# Patient Record
Sex: Female | Born: 1965 | Race: White | Hispanic: No | Smoking: Former smoker
Health system: Southern US, Community
[De-identification: ages and names within clinical notes are randomized; demographics above are authoritative.]

## PROBLEM LIST (undated history)

## (undated) DIAGNOSIS — T7840XA Allergy, unspecified, initial encounter: Secondary | ICD-10-CM

## (undated) DIAGNOSIS — G43909 Migraine, unspecified, not intractable, without status migrainosus: Secondary | ICD-10-CM

## (undated) DIAGNOSIS — E785 Hyperlipidemia, unspecified: Secondary | ICD-10-CM

## (undated) DIAGNOSIS — E039 Hypothyroidism, unspecified: Secondary | ICD-10-CM

## (undated) DIAGNOSIS — I10 Essential (primary) hypertension: Secondary | ICD-10-CM

## (undated) DIAGNOSIS — F419 Anxiety disorder, unspecified: Secondary | ICD-10-CM

## (undated) DIAGNOSIS — F329 Major depressive disorder, single episode, unspecified: Secondary | ICD-10-CM

## (undated) DIAGNOSIS — M1990 Unspecified osteoarthritis, unspecified site: Secondary | ICD-10-CM

## (undated) DIAGNOSIS — F32A Depression, unspecified: Secondary | ICD-10-CM

## (undated) DIAGNOSIS — K219 Gastro-esophageal reflux disease without esophagitis: Secondary | ICD-10-CM

## (undated) DIAGNOSIS — J449 Chronic obstructive pulmonary disease, unspecified: Secondary | ICD-10-CM

## (undated) DIAGNOSIS — G459 Transient cerebral ischemic attack, unspecified: Secondary | ICD-10-CM

## (undated) HISTORY — DX: Gastro-esophageal reflux disease without esophagitis: K21.9

## (undated) HISTORY — PX: FRACTURE SURGERY: SHX138

## (undated) HISTORY — DX: Unspecified osteoarthritis, unspecified site: M19.90

## (undated) HISTORY — DX: Chronic obstructive pulmonary disease, unspecified: J44.9

## (undated) HISTORY — DX: Essential (primary) hypertension: I10

## (undated) HISTORY — PX: PARTIAL HYSTERECTOMY: SHX80

## (undated) HISTORY — DX: Major depressive disorder, single episode, unspecified: F32.9

## (undated) HISTORY — PX: OTHER SURGICAL HISTORY: SHX169

## (undated) HISTORY — DX: Anxiety disorder, unspecified: F41.9

## (undated) HISTORY — DX: Depression, unspecified: F32.A

## (undated) HISTORY — DX: Hyperlipidemia, unspecified: E78.5

## (undated) HISTORY — DX: Allergy, unspecified, initial encounter: T78.40XA

## (undated) HISTORY — PX: ABDOMINAL HYSTERECTOMY: SHX81

## (undated) HISTORY — PX: CHOLECYSTECTOMY: SHX55

## (undated) HISTORY — DX: Migraine, unspecified, not intractable, without status migrainosus: G43.909

## (undated) HISTORY — DX: Hypothyroidism, unspecified: E03.9

---

## 1997-06-12 ENCOUNTER — Other Ambulatory Visit: Admission: RE | Admit: 1997-06-12 | Discharge: 1997-06-12 | Payer: Self-pay | Admitting: Obstetrics and Gynecology

## 1997-07-03 ENCOUNTER — Other Ambulatory Visit: Admission: RE | Admit: 1997-07-03 | Discharge: 1997-07-03 | Payer: Self-pay | Admitting: Obstetrics and Gynecology

## 1998-04-18 ENCOUNTER — Emergency Department (HOSPITAL_COMMUNITY): Admission: EM | Admit: 1998-04-18 | Discharge: 1998-04-18 | Payer: Self-pay | Admitting: Emergency Medicine

## 1998-04-19 ENCOUNTER — Encounter: Payer: Self-pay | Admitting: Obstetrics and Gynecology

## 1998-04-19 ENCOUNTER — Inpatient Hospital Stay (HOSPITAL_COMMUNITY): Admission: AD | Admit: 1998-04-19 | Discharge: 1998-04-23 | Payer: Self-pay | Admitting: Obstetrics and Gynecology

## 1998-04-23 ENCOUNTER — Encounter: Payer: Self-pay | Admitting: Obstetrics and Gynecology

## 1998-06-07 ENCOUNTER — Other Ambulatory Visit: Admission: RE | Admit: 1998-06-07 | Discharge: 1998-06-07 | Payer: Self-pay | Admitting: Obstetrics and Gynecology

## 1998-06-12 ENCOUNTER — Inpatient Hospital Stay (HOSPITAL_COMMUNITY): Admission: RE | Admit: 1998-06-12 | Discharge: 1998-06-15 | Payer: Self-pay | Admitting: *Deleted

## 1999-03-28 ENCOUNTER — Emergency Department (HOSPITAL_COMMUNITY): Admission: EM | Admit: 1999-03-28 | Discharge: 1999-03-28 | Payer: Self-pay | Admitting: Emergency Medicine

## 1999-03-28 ENCOUNTER — Encounter: Payer: Self-pay | Admitting: Emergency Medicine

## 2003-01-18 ENCOUNTER — Encounter: Admission: RE | Admit: 2003-01-18 | Discharge: 2003-01-18 | Payer: Self-pay | Admitting: Family Medicine

## 2003-10-18 ENCOUNTER — Encounter: Admission: RE | Admit: 2003-10-18 | Discharge: 2003-10-18 | Payer: Self-pay | Admitting: Occupational Medicine

## 2004-04-05 ENCOUNTER — Emergency Department (HOSPITAL_COMMUNITY): Admission: EM | Admit: 2004-04-05 | Discharge: 2004-04-05 | Payer: Self-pay | Admitting: Emergency Medicine

## 2004-04-09 ENCOUNTER — Ambulatory Visit (HOSPITAL_COMMUNITY): Admission: RE | Admit: 2004-04-09 | Discharge: 2004-04-09 | Payer: Self-pay | Admitting: Internal Medicine

## 2004-06-10 ENCOUNTER — Encounter: Admission: RE | Admit: 2004-06-10 | Discharge: 2004-06-10 | Payer: Self-pay | Admitting: Family Medicine

## 2004-06-13 ENCOUNTER — Encounter: Admission: RE | Admit: 2004-06-13 | Discharge: 2004-06-13 | Payer: Self-pay | Admitting: Family Medicine

## 2009-02-20 ENCOUNTER — Encounter: Admission: RE | Admit: 2009-02-20 | Discharge: 2009-02-20 | Payer: Self-pay | Admitting: Family Medicine

## 2009-11-20 ENCOUNTER — Other Ambulatory Visit: Admission: RE | Admit: 2009-11-20 | Discharge: 2009-11-20 | Payer: Self-pay | Admitting: Family Medicine

## 2010-02-22 ENCOUNTER — Encounter: Payer: Self-pay | Admitting: Family Medicine

## 2010-07-10 ENCOUNTER — Encounter: Payer: Self-pay | Admitting: Cardiovascular Disease

## 2010-07-10 ENCOUNTER — Other Ambulatory Visit: Payer: Self-pay | Admitting: Gastroenterology

## 2010-07-10 ENCOUNTER — Ambulatory Visit: Payer: Self-pay | Admitting: Cardiovascular Disease

## 2010-07-10 ENCOUNTER — Ambulatory Visit (INDEPENDENT_AMBULATORY_CARE_PROVIDER_SITE_OTHER): Payer: Self-pay | Admitting: Cardiovascular Disease

## 2010-07-10 VITALS — BP 134/92 | HR 92 | Resp 14 | Ht 64.0 in | Wt 266.0 lb

## 2010-07-10 DIAGNOSIS — R0600 Dyspnea, unspecified: Secondary | ICD-10-CM | POA: Insufficient documentation

## 2010-07-10 DIAGNOSIS — R7989 Other specified abnormal findings of blood chemistry: Secondary | ICD-10-CM

## 2010-07-10 DIAGNOSIS — R0989 Other specified symptoms and signs involving the circulatory and respiratory systems: Secondary | ICD-10-CM

## 2010-07-10 DIAGNOSIS — F17200 Nicotine dependence, unspecified, uncomplicated: Secondary | ICD-10-CM

## 2010-07-10 DIAGNOSIS — Z720 Tobacco use: Secondary | ICD-10-CM

## 2010-07-10 DIAGNOSIS — R0609 Other forms of dyspnea: Secondary | ICD-10-CM

## 2010-07-10 NOTE — Assessment & Plan Note (Signed)
Likely multifactorial with ongoing tobacco abuse, likely underlying lung disease however with her cardiac risk factors including obesity, strong family history of CAD, ongoing tobacco abuse, HTN, hyperlipidemia and DM will arrange Lexiscan myoview to exclude ischemia and echocardiogram to exclude pericardial effusion, valvular issues.

## 2010-07-10 NOTE — Patient Instructions (Signed)
Your physician recommends that you schedule a follow-up appointment in: 3-4 weeks with Dr. Clifton James  Your physician has requested that you have a lexiscan myoview. For further information please visit [email or web address] Please follow instruction sheet, as given.  Your physician has requested that you have an echocardiogram. Echocardiography is a painless test that uses sound waves to create images of your heart. It provides your doctor with information about the size and shape of your heart and how well your heart's chambers and valves are working. This procedure takes approximately one hour. There are no restrictions for this procedure.

## 2010-07-10 NOTE — Assessment & Plan Note (Signed)
Smoking cessation encouraged. She wishes to stop completely.

## 2010-07-10 NOTE — Progress Notes (Signed)
History of Present Illness:45 yo WF with history of HTN, hyperlipidemia,  tobacco abuse, GERD, DM, hypothyroidism, anxiety and depression who is here today for evaluation of SOB. Her primary care physician is Billee Cashing at [facility]. She has no prior cardiac history. She does have a strong family history of CAD. Her father died three weeks ago secondary to CHF. He had CAD diagnosed in his 48s. She began to notice swelling in her legs several years ago. She had a treadmill stress test over ten years ago.  Lately, she has noticed increase in dyspnea over last two months. No pressure or pain in her chest. She also notes episodes of dizziness. She was recently diagnosed with DM. No syncope. Her blood pressure and blood sugars have been uncontrolled lately.    Past Medical History  Diagnosis Date  . HTN (hypertension)   . GERD (gastroesophageal reflux disease)   . Hypothyroidism   . Diabetes mellitus   . Anxiety and depression   . Hyperlipidemia     Past Surgical History  Procedure Date  . Ankle surgery x 2   . Exploratory laparoscopy   . Partial hysterectomy     Current Outpatient Prescriptions  Medication Sig Dispense Refill  . fish oil-omega-3 fatty acids 1000 MG capsule Take 2 g by mouth daily.        Marland Kitchen glipiZIDE (GLUCOTROL) 10 MG 24 hr tablet Take 10 mg by mouth daily.        Marland Kitchen levothyroxine (SYNTHROID, LEVOTHROID) 200 MCG tablet Take 200 mcg by mouth daily.        . metFORMIN (GLUCOPHAGE-XR) 500 MG 24 hr tablet Take 500 mg by mouth daily with breakfast.        . potassium chloride (K-DUR,KLOR-CON) 10 MEQ tablet Take 10 mEq by mouth 2 (two) times daily.        . simvastatin (ZOCOR) 40 MG tablet Take 40 mg by mouth at bedtime.        . sulfamethoxazole-trimethoprim (BACTRIM DS) 800-160 MG per tablet Take 1 tablet by mouth 2 (two) times daily.        Marland Kitchen torsemide (DEMADEX) 10 MG tablet Take 10 mg by mouth daily.        . valsartan-hydrochlorothiazide (DIOVAN-HCT) 160-25 MG per tablet  Take 1 tablet by mouth daily.          Allergies  Allergen Reactions  . Compazine   . Lasix (Furosemide Injectable)     BREAKS OUT IN HIVES    History   Social History  . Marital Status: Married    Spouse Name: N/A    Number of Children: 1  . Years of Education: N/A   Occupational History  .  [employer]   Social History Main Topics  . Smoking status: Current Everyday Smoker -- 2.0 packs/day    Types: Cigarettes  . Smokeless tobacco: Not on file  . Alcohol Use: 0.5 oz/week    1 drink(s) per week  . Drug Use: No  . Sexually Active: Not on file   Other Topics Concern  . Not on file   Social History Narrative  . No narrative on file    Family History  Problem Relation Age of Onset  . Heart attack Father     Review of Systems:  As stated in the HPI and otherwise negative.   BP 134/92  Pulse 92  Resp 14  Ht 5\' 4"  (1.626 m)  Wt 266 lb (120.657 kg)  BMI 45.66 kg/m2  Physical Examination: General: Well developed, well nourished, NAD HEENT: OP clear, mucus membranes moist SKIN: warm, dry. No rashes. Neuro: No focal deficits Musculoskeletal: Muscle strength 5/5 all ext Psychiatric: Mood and affect normal Neck: No JVD, no carotid bruits, no thyromegaly, no lymphadenopathy. Lungs:Clear bilaterally, no wheezes, rhonci, crackles Cardiovascular: Regular rate and rhythm. No murmurs, gallops or rubs. Abdomen:Soft. Bowel sounds present. Non-tender.  Extremities: No lower extremity edema. Pulses are 2 + in the bilateral DP/PT.  EKG:NSR, rate 92 bpm. Low voltage.

## 2010-07-11 NOTE — Progress Notes (Signed)
Addended by: Marlou Starks on: 07/11/2010 11:05 AM   Modules accepted: Orders

## 2010-07-14 ENCOUNTER — Encounter: Payer: Self-pay | Admitting: *Deleted

## 2010-07-15 ENCOUNTER — Other Ambulatory Visit: Payer: BC Managed Care – PPO

## 2010-07-17 ENCOUNTER — Ambulatory Visit
Admission: RE | Admit: 2010-07-17 | Discharge: 2010-07-17 | Disposition: A | Discharge status: Home / Self Care | Payer: BC Managed Care – PPO | Source: Ambulatory Visit | Attending: Gastroenterology | Admitting: Gastroenterology

## 2010-07-17 DIAGNOSIS — R7989 Other specified abnormal findings of blood chemistry: Secondary | ICD-10-CM

## 2010-07-22 ENCOUNTER — Other Ambulatory Visit (HOSPITAL_COMMUNITY): Payer: BC Managed Care – PPO

## 2010-07-22 ENCOUNTER — Other Ambulatory Visit (HOSPITAL_COMMUNITY): Payer: BC Managed Care – PPO | Admitting: Radiology

## 2010-07-23 ENCOUNTER — Ambulatory Visit: Payer: Self-pay | Admitting: Cardiovascular Disease

## 2010-07-25 ENCOUNTER — Other Ambulatory Visit (HOSPITAL_COMMUNITY): Payer: Self-pay | Admitting: Gastroenterology

## 2010-07-25 DIAGNOSIS — K754 Autoimmune hepatitis: Secondary | ICD-10-CM

## 2010-07-25 DIAGNOSIS — R768 Other specified abnormal immunological findings in serum: Secondary | ICD-10-CM

## 2010-08-05 ENCOUNTER — Other Ambulatory Visit (HOSPITAL_COMMUNITY): Payer: BC Managed Care – PPO

## 2010-08-05 ENCOUNTER — Other Ambulatory Visit (HOSPITAL_COMMUNITY): Payer: BC Managed Care – PPO | Admitting: Radiology

## 2010-08-08 ENCOUNTER — Ambulatory Visit: Payer: BC Managed Care – PPO | Admitting: Cardiovascular Disease

## 2010-08-08 DIAGNOSIS — E039 Hypothyroidism, unspecified: Secondary | ICD-10-CM | POA: Insufficient documentation

## 2010-08-12 ENCOUNTER — Ambulatory Visit (HOSPITAL_BASED_OUTPATIENT_CLINIC_OR_DEPARTMENT_OTHER): Payer: BC Managed Care – PPO | Admitting: Radiology

## 2010-08-12 ENCOUNTER — Other Ambulatory Visit (HOSPITAL_COMMUNITY): Payer: BC Managed Care – PPO | Admitting: Radiology

## 2010-08-12 ENCOUNTER — Ambulatory Visit (HOSPITAL_COMMUNITY): Payer: BC Managed Care – PPO | Attending: Cardiovascular Disease | Admitting: Radiology

## 2010-08-12 VITALS — Ht 63.0 in | Wt 258.0 lb

## 2010-08-12 DIAGNOSIS — R0600 Dyspnea, unspecified: Secondary | ICD-10-CM

## 2010-08-12 DIAGNOSIS — I10 Essential (primary) hypertension: Secondary | ICD-10-CM | POA: Insufficient documentation

## 2010-08-12 DIAGNOSIS — R0609 Other forms of dyspnea: Secondary | ICD-10-CM

## 2010-08-12 DIAGNOSIS — E119 Type 2 diabetes mellitus without complications: Secondary | ICD-10-CM | POA: Insufficient documentation

## 2010-08-12 DIAGNOSIS — R609 Edema, unspecified: Secondary | ICD-10-CM

## 2010-08-12 DIAGNOSIS — R0989 Other specified symptoms and signs involving the circulatory and respiratory systems: Secondary | ICD-10-CM

## 2010-08-12 DIAGNOSIS — Z8249 Family history of ischemic heart disease and other diseases of the circulatory system: Secondary | ICD-10-CM | POA: Insufficient documentation

## 2010-08-12 DIAGNOSIS — R5381 Other malaise: Secondary | ICD-10-CM | POA: Insufficient documentation

## 2010-08-12 DIAGNOSIS — R42 Dizziness and giddiness: Secondary | ICD-10-CM | POA: Insufficient documentation

## 2010-08-12 DIAGNOSIS — R000 Tachycardia, unspecified: Secondary | ICD-10-CM | POA: Insufficient documentation

## 2010-08-12 DIAGNOSIS — R0602 Shortness of breath: Secondary | ICD-10-CM

## 2010-08-12 DIAGNOSIS — R0789 Other chest pain: Secondary | ICD-10-CM | POA: Insufficient documentation

## 2010-08-12 DIAGNOSIS — E669 Obesity, unspecified: Secondary | ICD-10-CM | POA: Insufficient documentation

## 2010-08-12 DIAGNOSIS — F17200 Nicotine dependence, unspecified, uncomplicated: Secondary | ICD-10-CM | POA: Insufficient documentation

## 2010-08-12 MED ORDER — TECHNETIUM TC 99M TETROFOSMIN IV KIT
33.0000 | PACK | Freq: Once | INTRAVENOUS | Status: AC | PRN
  Administered 2010-08-12: 33 via INTRAVENOUS

## 2010-08-12 MED ORDER — REGADENOSON 0.4 MG/5ML IV SOLN
0.4000 mg | Freq: Once | INTRAVENOUS | Status: AC
  Administered 2010-08-12: 0.4 mg via INTRAVENOUS

## 2010-08-12 NOTE — Progress Notes (Signed)
[facility] SITE 3 NUCLEAR MED [address] [place] [address] [phone]  Cardiology Nuclear Med Study  [name] is a 45 y.o. female [id number] [birth year]   Nuclear Med Background Indication for Stress Test:  Evaluation for Ischemia History:  COPD and >10 years GXT Cardiac Risk Factors: Family History - CAD, Hypertension, Lipids, NIDDM, Obesity and Smoker  Symptoms:  Chest Tightness, Dizziness, DOE, Fatigue, Rapid HR and SOB   Nuclear Pre-Procedure Caffeine/Decaff Intake:  None NPO After: 8:00pm   Lungs:  Clear after 1 puff Albuterol Inhaler IV 0.9% NS with Angio Cath:  20g  IV Site: R Antecubital  IV Started by:  Stanton Kidney, EMT-P  Chest Size (in):  40 Cup Size: C  Height: 5\' 3"  (1.6 m)  Weight:  258 lb (117.028 kg)  BMI:  Body mass index is 45.70 kg/(m^2). Tech Comments:  NA    Nuclear Med Study 1 or 2 day study: 2 day  Stress Test Type:  Eugenie Birks  Reading MD: Charlton Haws MD  Order Authorizing Provider:  C.McAlhaney  Resting Radionuclide: Technetium 77m Tetrofosmin  Resting Radionuclide Dose: 33 mCi   Stress Radionuclide:  Technetium 48m Tetrofosmin  Stress Radionuclide Dose: 33 mCi           Stress Protocol Rest HR: 80 Stress HR: 105  Rest BP: 140/93 Stress BP: 146/69  Exercise Time (min): n/a METS: n/a   Predicted Max HR: 173 bpm % Max HR: 46.24 bpm Rate Pressure Product: 21308   Dose of Adenosine (mg):  n/a Dose of Lexiscan: 0.4 mg  Dose of Atropine (mg): n/a Dose of Dobutamine: n/a mcg/kg/min (at max HR)  Stress Test Technologist: Milana Na, EMT-P  Nuclear Technologist:  Doyne Keel, CNMT     Rest Procedure:  Myocardial perfusion imaging was performed at rest 45 minutes following the intravenous administration of Technetium 86m Tetrofosmin. Rest ECG: NSR  Stress Procedure:  The patient received IV Lexiscan 0.4 mg over 15-seconds.  Technetium 39m Tetrofosmin injected at 30-seconds.  There were no significant changes  with Lexiscan.  Quantitative spect images were obtained after a 45 minute delay. Stress ECG: No significant change from baseline ECG  QPS Raw Data Images:  Normal; no motion artifact; normal heart/lung ratio. Stress Images:  Normal homogeneous uptake in all areas of the myocardium. Rest Images:  Normal homogeneous uptake in all areas of the myocardium. Subtraction (SDS):  Normal Transient Ischemic Dilatation (Normal <1.22):  .98 Lung/Heart Ratio (Normal <0.45):  .28  Quantitative Gated Spect Images QGS EDV:  79 ml QGS ESV:  25 ml QGS cine images:  NL LV Function; NL Wall Motion QGS EF: 69%  Impression Exercise Capacity:  Lexiscan with no exercise. BP Response:  Normal blood pressure response. Clinical Symptoms:  There is dyspnea. ECG Impression:  No significant ST segment change suggestive of ischemia. Comparison with Prior Nuclear Study: No images to compare  Overall Impression:  Normal stress nuclear study.     Charlton Haws

## 2010-08-13 ENCOUNTER — Ambulatory Visit (HOSPITAL_COMMUNITY): Payer: BC Managed Care – PPO | Attending: Cardiovascular Disease | Admitting: Radiology

## 2010-08-13 DIAGNOSIS — R0989 Other specified symptoms and signs involving the circulatory and respiratory systems: Secondary | ICD-10-CM

## 2010-08-13 MED ORDER — TECHNETIUM TC 99M TETROFOSMIN IV KIT
33.0000 | PACK | Freq: Once | INTRAVENOUS | Status: AC | PRN
  Administered 2010-08-13: 33 via INTRAVENOUS

## 2010-08-14 ENCOUNTER — Other Ambulatory Visit: Payer: Self-pay | Admitting: Gastroenterology

## 2010-08-14 ENCOUNTER — Ambulatory Visit (HOSPITAL_COMMUNITY)
Admission: RE | Admit: 2010-08-14 | Discharge: 2010-08-14 | Disposition: A | Discharge status: Home / Self Care | Payer: BC Managed Care – PPO | Source: Ambulatory Visit | Attending: Gastroenterology | Admitting: Gastroenterology

## 2010-08-14 ENCOUNTER — Other Ambulatory Visit: Payer: Self-pay | Admitting: Interventional Radiology

## 2010-08-14 ENCOUNTER — Telehealth: Payer: Self-pay | Admitting: *Deleted

## 2010-08-14 ENCOUNTER — Ambulatory Visit (HOSPITAL_COMMUNITY): Payer: BC Managed Care – PPO

## 2010-08-14 DIAGNOSIS — R945 Abnormal results of liver function studies: Secondary | ICD-10-CM | POA: Insufficient documentation

## 2010-08-14 DIAGNOSIS — K754 Autoimmune hepatitis: Secondary | ICD-10-CM

## 2010-08-14 DIAGNOSIS — R768 Other specified abnormal immunological findings in serum: Secondary | ICD-10-CM

## 2010-08-14 DIAGNOSIS — E119 Type 2 diabetes mellitus without complications: Secondary | ICD-10-CM | POA: Insufficient documentation

## 2010-08-14 DIAGNOSIS — E039 Hypothyroidism, unspecified: Secondary | ICD-10-CM | POA: Insufficient documentation

## 2010-08-14 DIAGNOSIS — I10 Essential (primary) hypertension: Secondary | ICD-10-CM | POA: Insufficient documentation

## 2010-08-14 DIAGNOSIS — Z79899 Other long term (current) drug therapy: Secondary | ICD-10-CM | POA: Insufficient documentation

## 2010-08-14 LAB — CBC
HCT: 47.4 % — ABNORMAL HIGH (ref 36.0–46.0)
MCH: 33.1 pg (ref 26.0–34.0)
MCV: 94.6 fL (ref 78.0–100.0)
WBC: 12.1 10*3/uL — ABNORMAL HIGH (ref 4.0–10.5)

## 2010-08-14 LAB — GLUCOSE, CAPILLARY: Glucose-Capillary: 134 mg/dL — ABNORMAL HIGH (ref 70–99)

## 2010-08-14 NOTE — Progress Notes (Signed)
nuc med report routed to Dr. Clifton James 08/14/10 [name]

## 2010-08-14 NOTE — Progress Notes (Signed)
Normal stress test. Can we let her know. Also see echo report. Thanks, chris

## 2010-08-14 NOTE — Telephone Encounter (Signed)
Called pt to give her results of stress test and echo. Left message to call back.

## 2010-08-15 NOTE — Telephone Encounter (Signed)
Pt given myoview and echo results 

## 2010-08-15 NOTE — Progress Notes (Addendum)
Pt aware [name] 3:28 PM

## 2010-09-01 ENCOUNTER — Encounter: Payer: Self-pay | Admitting: Cardiovascular Disease

## 2010-09-02 ENCOUNTER — Ambulatory Visit: Payer: BC Managed Care – PPO | Admitting: Cardiovascular Disease

## 2010-10-15 ENCOUNTER — Emergency Department (HOSPITAL_COMMUNITY): Payer: BC Managed Care – PPO

## 2010-10-15 ENCOUNTER — Observation Stay (HOSPITAL_COMMUNITY)
Admission: EM | Admit: 2010-10-15 | Discharge: 2010-10-18 | DRG: 034 | Disposition: A | Discharge status: Home / Self Care | Payer: BC Managed Care – PPO | Source: Ambulatory Visit | Attending: Internal Medicine | Admitting: Internal Medicine

## 2010-10-15 ENCOUNTER — Observation Stay (HOSPITAL_COMMUNITY): Payer: BC Managed Care – PPO

## 2010-10-15 DIAGNOSIS — I959 Hypotension, unspecified: Secondary | ICD-10-CM | POA: Insufficient documentation

## 2010-10-15 DIAGNOSIS — E119 Type 2 diabetes mellitus without complications: Secondary | ICD-10-CM | POA: Insufficient documentation

## 2010-10-15 DIAGNOSIS — F17200 Nicotine dependence, unspecified, uncomplicated: Secondary | ICD-10-CM | POA: Insufficient documentation

## 2010-10-15 DIAGNOSIS — I10 Essential (primary) hypertension: Secondary | ICD-10-CM | POA: Insufficient documentation

## 2010-10-15 DIAGNOSIS — G43909 Migraine, unspecified, not intractable, without status migrainosus: Secondary | ICD-10-CM | POA: Insufficient documentation

## 2010-10-15 DIAGNOSIS — G459 Transient cerebral ischemic attack, unspecified: Secondary | ICD-10-CM | POA: Insufficient documentation

## 2010-10-15 DIAGNOSIS — E785 Hyperlipidemia, unspecified: Secondary | ICD-10-CM | POA: Insufficient documentation

## 2010-10-15 DIAGNOSIS — R4789 Other speech disturbances: Principal | ICD-10-CM | POA: Insufficient documentation

## 2010-10-15 DIAGNOSIS — R0789 Other chest pain: Secondary | ICD-10-CM | POA: Insufficient documentation

## 2010-10-15 DIAGNOSIS — R079 Chest pain, unspecified: Secondary | ICD-10-CM

## 2010-10-15 DIAGNOSIS — K754 Autoimmune hepatitis: Secondary | ICD-10-CM | POA: Insufficient documentation

## 2010-10-15 DIAGNOSIS — E781 Pure hyperglyceridemia: Secondary | ICD-10-CM | POA: Insufficient documentation

## 2010-10-15 DIAGNOSIS — E039 Hypothyroidism, unspecified: Secondary | ICD-10-CM | POA: Insufficient documentation

## 2010-10-15 DIAGNOSIS — R609 Edema, unspecified: Secondary | ICD-10-CM | POA: Insufficient documentation

## 2010-10-15 LAB — CBC
HCT: 43.8 % (ref 36.0–46.0)
Hemoglobin: 15.7 g/dL — ABNORMAL HIGH (ref 12.0–15.0)
MCV: 93.8 fL (ref 78.0–100.0)
RBC: 4.67 MIL/uL (ref 3.87–5.11)
RDW: 13.5 % (ref 11.5–15.5)
WBC: 15.1 10*3/uL — ABNORMAL HIGH (ref 4.0–10.5)

## 2010-10-15 LAB — CK TOTAL AND CKMB (NOT AT ARMC)
CK, MB: 2.7 ng/mL (ref 0.3–4.0)
Relative Index: INVALID (ref 0.0–2.5)
Total CK: 32 U/L (ref 7–177)

## 2010-10-15 LAB — COMPREHENSIVE METABOLIC PANEL
AST: 61 U/L — ABNORMAL HIGH (ref 0–37)
Albumin: 3.2 g/dL — ABNORMAL LOW (ref 3.5–5.2)
BUN: 15 mg/dL (ref 6–23)
Calcium: 8.7 mg/dL (ref 8.4–10.5)
Chloride: 100 mEq/L (ref 96–112)
Creatinine, Ser: 0.62 mg/dL (ref 0.50–1.10)
GFR calc non Af Amer: >60 mL/min (ref >60)
Total Bilirubin: 0.4 mg/dL (ref 0.3–1.2)

## 2010-10-15 LAB — DIFFERENTIAL
Basophils Absolute: 0.1 10*3/uL (ref 0.0–0.1)
Eosinophils Relative: 1 % (ref 0–5)
Lymphocytes Relative: 25 % (ref 12–46)
Lymphs Abs: 3.8 10*3/uL (ref 0.7–4.0)
Neutro Abs: 9.8 10*3/uL — ABNORMAL HIGH (ref 1.7–7.7)
Neutrophils Relative %: 65 % (ref 43–77)

## 2010-10-15 LAB — CARDIAC PANEL(CRET KIN+CKTOT+MB+TROPI)
Total CK: 68 U/L (ref 7–177)
Troponin I: <0.3 ng/mL (ref <0.30)

## 2010-10-15 LAB — TROPONIN I: Troponin I: <0.3 ng/mL (ref <0.30)

## 2010-10-15 LAB — PROTIME-INR: INR: 0.91 (ref 0.00–1.49)

## 2010-10-15 LAB — TSH: TSH: 0.075 u[IU]/mL — ABNORMAL LOW (ref 0.350–4.500)

## 2010-10-15 LAB — APTT: aPTT: 35 seconds (ref 24–37)

## 2010-10-15 LAB — POCT I-STAT TROPONIN I: Troponin i, poc: 0 ng/mL (ref 0.00–0.08)

## 2010-10-15 LAB — GLUCOSE, CAPILLARY: Glucose-Capillary: 113 mg/dL — ABNORMAL HIGH (ref 70–99)

## 2010-10-15 MED ORDER — IOHEXOL 300 MG/ML  SOLN
100.0000 mL | Freq: Once | INTRAMUSCULAR | Status: AC | PRN
  Administered 2010-10-15: 100 mL via INTRAVENOUS

## 2010-10-16 ENCOUNTER — Observation Stay (HOSPITAL_COMMUNITY): Payer: BC Managed Care – PPO

## 2010-10-16 DIAGNOSIS — G459 Transient cerebral ischemic attack, unspecified: Secondary | ICD-10-CM

## 2010-10-16 LAB — CARDIAC PANEL(CRET KIN+CKTOT+MB+TROPI)
CK, MB: 2.2 ng/mL (ref 0.3–4.0)
Relative Index: INVALID (ref 0.0–2.5)
Total CK: 32 U/L (ref 7–177)
Troponin I: <0.3 ng/mL (ref <0.30)

## 2010-10-16 LAB — GLUCOSE, CAPILLARY
Glucose-Capillary: 129 mg/dL — ABNORMAL HIGH (ref 70–99)
Glucose-Capillary: 108 mg/dL — ABNORMAL HIGH (ref 70–99)

## 2010-10-17 ENCOUNTER — Inpatient Hospital Stay (HOSPITAL_COMMUNITY): Payer: BC Managed Care – PPO

## 2010-10-17 LAB — BASIC METABOLIC PANEL
BUN: 13 mg/dL (ref 6–23)
Creatinine, Ser: 0.52 mg/dL (ref 0.50–1.10)
GFR calc non Af Amer: >60 mL/min (ref >60)
Glucose, Bld: 114 mg/dL — ABNORMAL HIGH (ref 70–99)
Potassium: 3.7 mEq/L (ref 3.5–5.1)

## 2010-10-17 LAB — CBC
HCT: 39.4 % (ref 36.0–46.0)
MCH: 33 pg (ref 26.0–34.0)
MCV: 93.6 fL (ref 78.0–100.0)
RBC: 4.21 MIL/uL (ref 3.87–5.11)
WBC: 7.7 10*3/uL (ref 4.0–10.5)

## 2010-10-17 LAB — GLUCOSE, CAPILLARY
Glucose-Capillary: 96 mg/dL (ref 70–99)
Glucose-Capillary: 140 mg/dL — ABNORMAL HIGH (ref 70–99)

## 2010-10-17 LAB — LIPID PANEL
HDL: 30 mg/dL — ABNORMAL LOW (ref >39)
LDL Cholesterol: 81 mg/dL (ref 0–99)
Total CHOL/HDL Ratio: 5.8 RATIO
VLDL: 62 mg/dL — ABNORMAL HIGH (ref 0–40)

## 2010-10-17 LAB — HEMOGLOBIN A1C: Mean Plasma Glucose: 157 mg/dL — ABNORMAL HIGH (ref <117)

## 2010-10-17 LAB — T4, FREE: Free T4: 1.37 ng/dL (ref 0.80–1.80)

## 2010-10-17 MED ORDER — GADOBENATE DIMEGLUMINE 529 MG/ML IV SOLN
20.0000 mL | Freq: Once | INTRAVENOUS | Status: AC
  Administered 2010-10-17: 20 mL via INTRAVENOUS

## 2010-10-18 LAB — CBC
MCH: 32 pg (ref 26.0–34.0)
MCHC: 34 g/dL (ref 30.0–36.0)
Platelets: 228 10*3/uL (ref 150–400)
RBC: 4.38 MIL/uL (ref 3.87–5.11)

## 2010-10-18 LAB — BASIC METABOLIC PANEL
CO2: 26 mEq/L (ref 19–32)
Calcium: 8.7 mg/dL (ref 8.4–10.5)
GFR calc non Af Amer: >60 mL/min (ref >60)
Sodium: 140 mEq/L (ref 135–145)

## 2010-10-18 LAB — GLUCOSE, CAPILLARY: Glucose-Capillary: 133 mg/dL — ABNORMAL HIGH (ref 70–99)

## 2010-10-20 NOTE — Consult Note (Addendum)
NAMEMAKENZY, KRIST NO.:  [id number]  MEDICAL RECORD NO.:  [id number]  LOCATION:  [facility]                         FACILITY:  [facility]  PHYSICIAN:  Vesta Mixer, M.D. DATE OF BIRTH:  Nov 13, 1965  DATE OF CONSULTATION:  10/15/2010 DATE OF DISCHARGE:                                CONSULTATION   PRIMARY CARDIOLOGIST:  Verne Carrow, MD  PRIMARY MEDICAL DOCTOR:  Georgette Shell, PA at [facility]  CHIEF COMPLAINT:  Difficulty talking and headache.  REASON FOR CONSULTATION:  Chest pain.  HISTORY OF PRESENT ILLNESS:  [name] is a 45 year old female with history of hypertension, diabetes, hyperlipidemia, and tobacco abuse who was evaluated in July 2012 for dyspnea by Dr. Clifton James and was found to have a negative nuclear stress test at that time.  Echocardiogram demonstrated EF of 55% to 60% with grade 1 diastolic dysfunction and elevated PA pressure of 30 mmHg.  She went to her primary care provider's office with an episode of difficulty talking for approximately 20-30 minutes today.  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  She became concerned, so went to [facility].  She also noted a headache.  Because of these findings, she was sent to the ER.  She also mentioned a brief episode of chest pain while shaving her legs on Monday, without particular exacerbation of the associated symptoms.  She has chronic dyspnea and has had lower extremity edema for several months.  Outside of this episode, she does note occasional seconds of chest discomfort, but would have not even mentioned it if not asked.  Now she is chest pain, shortness of breath free.  Lab work is pending.  Her mental status is improved and she is coherent.  EKG demonstrates no acute changes.  PAST MEDICAL HISTORY: 1. Normal nuclear stress test August 12, 2010, with EF 69%.  2-D     echocardiogram at that time showed EF of 55% to 60% with mild LVH.     No  wall motion abnormalities.  Grade 1 diastolic dysfunction, mild     MR, and PA pressure of 38 mmHg. 2. Hypertension. 3. GERD. 4. Diabetes mellitus. 5. Hypothyroidism. 6. 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 7. Hyperlipidemia. 8. Anxiety/depression.  PAST SURGICAL HISTORY:  Exploratory laparotomy, ankle surgery, and partial hysterectomy.  MEDICATIONS:  Per office note of June 2012, the patient was taking, 1. Fish oil 2000 mg daily. 2. Glipizide 10 mg daily. 3. Synthroid 200 mcg daily. 4. Metformin 500 mg daily. 5. Potassium chloride 10 mEq b.i.d. 6. Zocor 40 mg at bedtime. 7. Bactrim DS 1 tablet b.i.d. 8. Torsemide 10 mg daily. 9. Losartan/hydrochlorothiazide 160/25 mg daily.  ALLERGIES:  TORECAN, COMPAZINE, and LASIX.  SOCIAL HISTORY:  [name] is married, has one child.  She is an ongoing smoker and smokes two packs per day.  She drinks one drink weekly.  She works for [employer]  She denies any illicit drug use.  FAMILY HISTORY:  Positive for coronary artery disease.  Her father was diagnosed with it in his 57s and had an MI.  REVIEW OF  SYSTEMS:  All other systems reviewed and otherwise negative except for those noted in HPI.  LABORATORY FINDINGS:  Pending.  EKG normal sinus rhythm without acute changes.  RADIOLOGIC STUDIES:  Pending.  PHYSICAL EXAMINATION:  VITAL SIGNS:  Temperature 97.5, pulse 102, respirations 20, initial blood pressure 86/61 with repeat of 114 systolic, pulse ox 97% on room air. GENERAL:  This is a pleasant obese white female in no acute distress, well-developed, well-nourished. HEENT:  Normocephalic and atraumatic.  Extraocular movements intact. Clear sclerae.  Dentition is poor. NECK:  Supple without carotid bruit.  JVD is not seen, but difficult to assess. HEART:  Auscultation of the heart reveals regular rate and rhythm with S1 and S2 without significant murmurs, rubs, or  gallops. LUNGS:  Slight expiratory wheeze throughout, no rales or rhonchi. ABDOMEN:  Soft, nontender, nondistended.  Positive bowel sounds. EXTREMITIES:  Warm and dry with 1 to 2+ pedal edema.  Lower extremity pulses are difficult to assess secondary to edema. NEUROLOGIC:  She is alert and oriented x3, responds questions appropriately.  Cranial nerves II through XII grossly intact.  ASSESSMENT/PLAN:  The patient was seen and examined by Dr. Elease Hashimoto and myself.  This is a 45 year old lady with no history of coronary artery disease with a negative stress test in July 2012, hypertension, diabetes mellitus, and obesity who presents with an episode of dysarthria and confusion today, headache, with mention of chest discomfort 2 days ago without recurrence.  We do not feel that this represents an acute coronary syndrome, but do feel that her unusual speech impairment requires further workup including TIA rule out.  However, she is slightly tachycardic and with her complained of chronic dyspnea and lower extremity edema, we feel that she requires a rule out for pulmonary embolism as well.  With the negative predictive value less useful in patients that are high risk for PE, we will obtain CT angio if her creatinine is normal.  We will cycle cardiac enzymes and be happy to follow with you.  At this time, we doubt that this is primarily cardiac in etiology.     Ronie Spies, P.A.C.   ______________________________ Vesta Mixer, M.D.    DD/MEDQ  D:  10/15/2010  T:  10/15/2010  Job:  [id number]  cc:   Verne Carrow, MD  Electronically Signed by Kristeen Miss M.D. on 10/20/2010 05:36:34 AM Electronically Signed by Ronie Spies  on 10/30/2010 07:04:07 PM

## 2010-10-30 NOTE — Consult Note (Signed)
[name], [name]:  [id number]  MEDICAL RECORD NO.:  [id number]  LOCATION:  3731                         FACILITY:  [facility]  PHYSICIAN:  [name], MD    DATE OF BIRTH:  1966/01/04  DATE OF CONSULTATION:  10/16/2010 DATE OF DISCHARGE:                                CONSULTATION   REASON FOR CONSULTATION:  Transient expressive difficulties lasting approximately 12-20 minutes in the setting of questionable and marked positive MRI for right frontal stroke and bilateral carotid Doppler showing 60-79% stenosis.  HISTORY OF PRESENT ILLNESS:  This is a pleasant 45 year old female who does have a past medical history of hypertension, diabetes, autoimmune hepatitis, hypothyroidism, pedal edema, morbid obesity and migraine headaches.  The patient is a Runner, broadcasting/film/video.  On the morning of the event, the patient noted that she had a migraine headache, which typically consist of right orbital pain and scotoma in the right visual field.  The patient's migraine headaches were usually well controlled with aspirin. The patient took two aspirin that morning.  Later in the afternoon, she was teaching a class when she noted she had a 15-20 minutes period and when she was able start her sentences, but could not finish her sentences due to inability to express herself.  She describes the expressive aphasia as inability to find the words that she wanted to use; however, she did know what she wanted to say.  Initially, the patient did not feel she is able to go to the emergency department; however, due to coworkers fearful she might have had a stroke, the patient was brought to the emergency department.  The patient denies any numbness, tingling, any facial abnormalities, or any visual difficulties.  The patient denies ever having neurological symptoms other than scotoma with her migraine headaches and denies having any speech deficits with migraine headaches in the past.  The  patient has not had any of these symptoms while in hospital.  In addition to the above symptoms, the patient also had sensation of chest pressure approximately week and a half prior to the event.  PAST MEDICAL HISTORY:  As noted above.  MEDICATIONS:  The patient at home was on Diovan, metformin, Synthroid, and Zocor.  However while she has been in the hospital, she has been placed on aspirin, Glucotrol, sliding-scale insulin, levothyroxine, Ativan, nicotine patch, and Zocor.  ALLERGIES: 1. COMPAZINE. 2. LASIX. 3. TORECAN.  SOCIAL HISTORY:  The patient smokes one pack per day and has done so since 45 years of age.  She does not drink or use illicit drugs.  She is an Retail buyer.  REVIEW OF SYSTEMS:  Negative with the exception above.  PHYSICAL EXAMINATION:  VITAL SIGNS:  Blood pressure is 114/80, pulse 96, respiration 19, and temperature 97.7. GENERAL:  The patient is alert and oriented x3, carries out two-three- step commands with any difficulty. NEUROLOGIC:  Pupils are equal, round, and reactive to light and accommodating approximately 3 mm, 2 mm conjugate gaze.  Extraocular movements are intact.  Visual fields grossly intact to double simultaneous stimuli.  Face is symmetric.  Tongue is midline.  Uvula is midline.  The patient shows no dysarthria, no aphasia, no slurred speech.  Facial sensation is full to pinprick and light touch.  Shoulder shrug and head turn is within normal limits.  The patient shows no drift in the upper or lower extremities.  The patient's sensation is globally intact to pinprick and light touch.  Coordination:  Finger-to-nose and heel-to-shin are smooth.  Motor is 5/5 throughout.  5/5 throughout with no asterixis, tremor, or abnormal muscle movements.  Deep tendon reflexes 2+ throughout with downgoing toes bilaterally. PULMONARY:  Clear to auscultation. CARDIOVASCULAR:  S1 and S2 is audible. NECK:  Negative for bruits.  LABORATORY DATA:  TSH  is 0.075.  Sodium 136, potassium 4.2, chloride 100, CO2 is 25, BUN 15, creatinine 0.62, and glucose 98.  White blood cell count 15.1, platelets 277, hemoglobin 15.7, hematocrit 43.8.  HbA1c and fasting lipid panel pending.  IMAGING:  MRI of brain was read as an equivocal foci of questionable stroke in the right frontal region, otherwise negative for mass, bleed, or intracranial abnormality.  ASSESSMENT:  This is a 45 year old female with history of migraine headaches who experienced a 15- to 20-minute period of expressive difficulties on Monday.  The patient was admitted to the hospital.  The patient's MRI showed a questionable equivocal foci of questionable stroke in the right frontal region.  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  The patient's recent 2-D echo in July showed no abnormalities and no patent foramen ovale.  RECOMMENDATIONS:  At this time given the patient's possibility of right frontal stroke and bilateral ICA stenosis in the setting of multiple stroke risk factors, we would recommend: 1. MRI of brain without contrast, MRA of brain without contrast, MRA of neck with contrast. 2. Continue aspirin on a daily basis. 3. HbA1c and fasting lipid panel. 4. Continue addressing additional stroke risk factors.  We will continue to follow these results and give recommendations.  Dr. Thad Ranger has seen and evaluated the patient and agrees with above- mentioned.     [name], PA-C   ______________________________ [name], MD    DS/MEDQ  D:  10/16/2010  T:  10/16/2010  Job:  [id number]  Electronically Signed by [name] PA-C on 10/24/2010 03:38:35 PM Electronically Signed by [name] MD on 10/30/2010 03:40:33 PM

## 2010-11-13 NOTE — Discharge Summary (Signed)
[name], [name]:  [id number]  MEDICAL RECORD NO.:  [id number]  LOCATION:  3731                         FACILITY:  [facility]  PHYSICIAN:  Rosanna Randy, MDDATE OF BIRTH:  25-Apr-1965  DATE OF ADMISSION:  10/15/2010 DATE OF DISCHARGE:  10/18/2010                              DISCHARGE SUMMARY   DISCHARGE DIAGNOSES: 1. Expressive aphasia and speech abnormality transient most likely     secondary to transient ischemic attack versus complicated migraine. 2. Migraines. 3. Hypothyroidism. 4. Hypertension. 5. Hyperlipidemia with hypertriglyceridemia. 6. Diabetes mellitus. 7. Advanced nonspecific white matter signal changes on the patient's     MRI. 8. Morbid obesity. 9. Tobacco abuse. 10.Noncardiac chest discomfort most likely secondary to     gastroesophageal reflux disease. 11.Transaminitis.  DISCHARGE MEDICATIONS: 1. Aspirin 81 mg 1 tablet by mouth daily. 2. Levothyroxine 150 mg 1 tablet by mouth daily before breakfast on     empty stomach and 30-40 minutes away from any other medications. 3. Lisinopril 5 mg 1 tablet by mouth daily. 4. Nicotine patch 21 mg transdermally daily in order to help the     patient quitting smoking. 5. Fish oil 1000 mg 2 capsules by mouth daily at bedtime. 6. Tylenol 500 mg 2 tablets by mouth twice a day as needed for pain. 7. Demadex 10 mg 1 tablet by mouth every morning. 8. Glipizide XL 10 mg 1 tablet by mouth every morning. 9. Metformin 1000 mg by mouth twice a day. 10.Potassium chloride 10 mEq 1 tablet by mouth every morning. 11.Zocor 40 mg 1 tablet by mouth daily at bedtime.  DISPOSITION AND FOLLOWUP:  The patient had been discharged in stable improved condition, currently not complaining of any neurologic deficit, any shortness of breath, any chest pain, or any acute complaints.  The patient has been instructed to arrange a followup appointment over the next 7-10 days with Dr. Karleen Hampshire, primary care physician.   It will be important during that appointment to make sure that the patient had been compliant with his medications and make any further adjustment that she will require.  She will definitely need in about 1 month a TSH recheck since she was having iatrogenic hyperthyroidism due to high dose Synthroid.  The patient was instructed how to use her Synthroid.  Dose was adjusted based on her TSH and free T4 level and she will need to be followed for further adjustment.  The patient will also need further adjustment on the medications for her hypertension and also for her diabetes and hyperlipidemia.  The patient was instructed to follow a low- sodium diet and low carbohydrate and low-fat and also low-calorie and she is looking to stop smoking and do more exercises.  The patient had also received instructions to arrange a followup appointment with [facility] office specifically for further treatments of her migraines and also for further followup of the advanced nonspecific white matter signal changes on the MRI as recommended by Dr. Thad Ranger, neurologist during this hospitalization. The patient instructed to take baby aspirin on daily basis for now on.  PROCEDURE PERFORMED DURING THIS HOSPITALIZATION:  On September 12, the patient had a chest x-ray that demonstrated no acute cardiopulmonary  disease.  A CT of the head without contrast demonstrated no acute intraparenchymal disease.  A CT angio of the chest was negative for acute pulmonary embolism.  There were some pulmonary emphysematous changes and mild aortic atherosclerosis.  MRI of the brain without contrast done on September 13 demonstrated some premature atrophy white chronic microvascular ischemic changes with equivocal foci of subcortical white matter infarction in the right frontal and left parietal regions.  Unfortunately the study was suboptimal due to the patient's movement and lack of cooperation with the  tests, so this diagnosis was nonconclusive and the recommendation was to have this MRI of the brain repeated in 2-3 days in order to make the right diagnosis. On September 14 after discussion with the neurology team, an MRI of the brain without contrast and also an MRA of the head without contrast with an MRA of the neck with contrast was ordered and the impression demonstrated no acute infarct.  There was a nonspecific advanced cerebral white matter signal changes without enhancement with a differential consideration including accelerated small vessel ischemia disease, hypercoagulable state, migraines, or demyelination process.  The MRA of the neck demonstrated negative neck MRA and negative intracranial MRA.  The patient had carotid Dopplers on October 16, 2010 that demonstrated bilateral vertebral antegrade high velocity Doppler exam with tortuosity of bilateral ICA suggesting 60-79% stenosis that was then ruled out with an MRA of her neck.  No other procedures were performed during this hospitalization.  Neurology was consulted during this admission.  Cardiology was also consulted during this admission.  HISTORY OF PRESENT ILLNESS:  For full details please refer to dictation done by Dr. Butler Denmark on October 15, 2010, but briefly this is a 45 year old female who has been recently diagnosed with hypertension, diabetes, autoimmune hepatitis, and hypothyroidism and came into the hospital complaining of experiencing difficulty expressing herself and finding the right words with mild dysarthria.  Pertinent laboratory data throughout this hospitalization includes cardiac markers negative x3. CBC with differential showing white blood cells of 7.7, hemoglobin 13.9, platelets 214,000, TSH of 0.075 with a repeated TSH showing 0.081 and a free T4 of 1.3.  Hemoglobin A1c was 7.1.  Lipid profile demonstrated a triglyceride of 308 with HDL of 30, total cholesterol 173, LDL 81.  A comprehensive  metabolic panel demonstrated mild transaminitis with a sodium of 136, potassium 4.2, chloride 100, bicarb 25, glucose 98, BUN 15, creatinine 0.62.  Her albumin was 3.2 at discharge.  BMET showed a sodium of 140, potassium 3.8, chloride 104, bicarb 26, glucose 137, BUN 13, creatinine 0.57.  HOSPITAL COURSE BY PROBLEM: 1. The patient's chest pain is noncardiac in etiology with a negative     cardiac enzymes and no changes on telemetry or EKG.  Cardiology was     initially consulted and after having all this normal results, they     have recommended the patient to be seen as an outpatient for a     probable starting treatment for GERD as the main cause of her chest     pain.  We are going to allow primary care physician to review one     more time the patient's symptoms but specifically due to her     obesity she is at high risk of reflux disease and might benefit of     starting some type of a PPI.. 2. Expressive aphasia and speech abnormality at this point most likely     secondary to TIA versus complicated migraine.  She is at this point     without any neurologic deficit or any further abnormalities and     after discussing with Neurology and having a whole workup done to     rule out any stroke, the patient had been started on aspirin and     had received recommendations to follow with Neurology as an     outpatient in order to continue treating her migraines while     following the advanced nonspecific white matter signal changes seen     on the MRI.  The patient was instructed to also work on her     comorbidities in order to decrease the risk of further     cerebrovascular accident in the future. 3. Hypothyroidism with iatrogenic decreased TSH most likely due to     high dose of levothyroxine.  The patient's dose was adjusted and     instructions on how to use the medications were given. 4. Hypertension, stable, well controlled.  We are going to continue     using Demadex in the  morning and for renal protection in a patient     with diabetes.  We will also add lisinopril.  Further adjustment     and increasing the doses can be done as an outpatient.  The patient     instructed to follow a low-sodium diet. 5. Hyperlipidemia with dyslipidemia and elevated triglycerides.  The     patient will continue using Zocor and fish oil 2 grams by mouth     daily has been also added to her medications list. 6. Diabetes, hemoglobin A1c 7.1.  We are going to continue using     metformin and glipizide.  The patient instructed to start the     metformin on Sunday in order to be 48 hours after the use of     contrast to prevent any nephrotoxicity associated with contrast on     metformin use. 7. Advanced nonspecific white matter signal changes on the MRI.  The     patient had been instructed to follow with a neurologist as an     outpatient following recommendation of the Inpatient Neurology     Service and she will be using aspirin on daily basis.  No focal or     acute infarct was demonstrated on her MRIs. 8. Morbid obesity.  The patient instructed to follow a low-calorie     diet and to increase her exercise level or being compliant with the     medications for her diabetes. 9. Tobacco abuse.  She had received a prescription for nicotine patch     and received tobacco cessation counseling during this     hospitalization.  She will require further support as an outpatient     in order to quit.  PHYSICAL EXAMINATION ON DISCHARGE:  VITAL SIGNS:  Temperature 97.3, heart rate 88, respiratory rate 18, blood pressure 138/95, oxygen saturation 93-94% on room air. GENERAL:  She was in no acute distress. RESPIRATORY:  Clear to auscultation. HEART:  Regular rate and rhythm. ABDOMEN:  Soft, nontender with positive bowel sounds. EXTREMITIES:  Edema 1+ bilaterally. NEUROLOGIC:  Nonfocal.     Rosanna Randy, MD     CEM/MEDQ  D:  10/18/2010  T:  10/18/2010  Job:   [id number]  cc:   Georgette Shell, PA [facility]  Electronically Signed by Vassie Loll MD on 11/13/2010 08:00:09 AM

## 2010-11-19 NOTE — H&P (Signed)
[name], WALLANDER             ACCOUNT NO.:  [id number]  MEDICAL RECORD NO.:  [id number]  LOCATION:  [facility]                         FACILITY:  [facility]  PHYSICIAN:  Calvert Cantor, M.D.     DATE OF BIRTH:  14-Jul-1965  DATE OF ADMISSION:  10/15/2010 DATE OF DISCHARGE:                             HISTORY & PHYSICAL   REFERRING PHYSICIAN:  Trudi Ida. Denton Lank, M.D.  PRIMARY CARE:  Georgette Shell, PA at [facility].  PRESENTING COMPLAINT:  Sent from physician's office due to multiple issues over this past week and a half.  HISTORY OF PRESENT ILLNESS:  This is a 45 year old female who has recently been diagnosed with hypertension, diabetes, autoimmune hepatitis, and hypothyroidism.  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  She states that she knew what she wanted to say, but it would not, out and this lasted 15-20 minutes.  She did not have any other neurological symptoms such as double vision, blurred vision, focal weakness or numbness.  She states she did have a migraine that morning with her typical aura which was glittering in the right eye field.  She took two aspirin and her migraine became a dull headache.  She states that this is common for her.  She has not had any neurological symptoms since that day.  The patient also states that a week and a half ago on the prior Monday, she had a sensation of her chest closing.  She states she was taking a shower.  She suddenly felt like she could not breathe and her chest was closing up on her.  She felt almost as if she would pass out and went and laid down on the bed.  She apparently called her aunt and her aunt came to check on her and the patient had trouble focusing on what her aunt was saying, this episode resolved quickly.  The patient has been recently diagnosed with the above-mentioned medical issues which is hypertension,  diabetes, autoimmune hepatitis, and hypothyroidism.  She also has problems with pedal edema.  She underwent a stress test and a 2-D echo this past June and July.  2D echo revealed grade 1 diastolic dysfunction and her stress test did not reveal any reversible ischemia.  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  In addition, she has been receiving treatment for autoimmune hepatitis through Dr. Loreta Ave.  She just finished a course of prednisone 2 days ago.  PAST MEDICAL HISTORY: 1. Hypertension. 2. Diabetes mellitus. 3. Autoimmune hepatitis. 4. Hypothyroidism. 5. Pedal edema. 6. The patient is morbidly obese.  PAST SURGICAL HISTORY:  Partial hysterectomy, ankle fractures repaired with plates and pins.  SOCIAL HISTORY:  She smokes a pack a day since age 41.  She does not drink or use drugs.  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  ALLERGIES:  COMPAZINE, LASIX, and TORCAN.  MEDICATIONS:  She can remember the following, 1. Diovan. 2. Metformin.  She states she takes 2 tablets twice a day. 3. Synthroid. 4. Zocor.  She knows she is also on another diabetes medication.  She does not take aspirin.  REVIEW OF SYSTEMS:  She has lost about 15 pounds in weight since July. She does get migraine headaches.  HEENT: No sore throat, sinus trouble, earache.  No blurred vision, double vision.  RESPIRATORY: She has dyspnea with mild-to-moderate exertion, which is not new.  No cough.  No wheezing.  CARDIAC:  As mentioned in HPI.  No palpitations.  She does have problem with pedal edema.  GI:  She has reflux and diarrhea, which she suspects is from her metformin.  GU:  No dysuria or hematuria. HEMATOLOGIC:  Bruises easily when she drinks a lot of soda. MUSCULOSKELETAL:  Has some mild problems with arthritis.  NEUROLOGIC: Never had a stroke, mini stroke, or seizure in the past.  PSYCHOLOGIC: Admits to some anxiety and depression.  PHYSICAL EXAMINATION:  VITAL SIGNS:  Blood pressure 86/61,  pulse 102, respiratory rate 20, temperature 97.5, oxygen saturation is 95%. HEENT:  Pupils are equal, round, reactive to light.  Extraocular movements are intact.  Conjunctivae is pink.  No scleral icterus.  Oral mucosa is moist.  She has an abrasion on the roof of her mouth. Oropharynx is clear.  Normal dentition. NECK:  Supple.  No thyromegaly or lymphadenopathy.  No carotid bruits. HEART:  Regular rate and rhythm.  No murmurs. LUNGS:  Clear bilaterally.  Normal respiratory effort.  No use of accessory muscles. ABDOMEN:  Soft, nontender, nondistended.  Bowel sounds positive.  Unable to check for organomegaly due to obesity. EXTREMITIES:  She has pitting edema in bilateral feet.  Pedal pulses are difficult to palpate.  No cyanosis or clubbing. NEUROLOGIC:  Cranial nerves II through XII are intact.  Able to move all four extremities appropriately. PSYCHOLOGIC:  Awake, alert, and oriented x3.  Mood and affect are normal. SKIN:  Warm and dry.  No rash or bruising.  LABORATORY DATA:  Metabolic panel reveals an AST of 61 and ALT of 71. Everything else appears to be within normal limits.  CBC reveals a WBC count of 15.1 which may be related to recent prednisone use, hemoglobin is 15.7, hematocrit 43.8, platelets 227.  Chest x-ray two view, no acute cardiopulmonary disease.  CT of the head without contrast, no acute intraparenchymal disease. There is mild diffuse increased attenuation of the vasculature in the dural sinuses which may suggest hypovolemia.  First set of cardiac markers is negative.  Coags are within normal limits.  ASSESSMENT AND PLAN: 1. Expressive aphasia on Monday lasting 15-20 minutes.  This could be     a transient ischemic attack versus cerebrovascular accident.  We     will obtain an MRI and a carotid Doppler.  She had an echo 2 months     ago which I will not be repeating.  In addition, she states she had     a lipid panel a couple of months ago as well and her  Zocor was not     increased at that time.  She is trying to take a low-fat diet.  At     this point, I will not be repeating lipids either.  I have     explained to her the risk factors that she has, which could cause a     CVA and the fact that she does need to modify her risk factors,     which she admits that she is working on. 2. Chest tightness with shortness of breath about a week and a half     ago.  Cardiology has been consulted.  They  have ordered a CT of her     chest and cardiac enzymes. 3. Diabetes mellitus.  Continue home meds once dosages are available     and we will probably be holding her metformin because of her chest     CT.  I will place her on a sliding scale for now. 4. Hypotension.  Hold the patient's antihypertensive.  She is     receiving a fluid bolus and I will continue saline after that at     125 mL an hour.  She may be over diuresed. 5. Pedal edema may be related to her grade 1 diastolic dysfunction     plus venous insufficiency.  She should be wearing compression     stockings. 6. Nicotine abuse.  I have ordered a nicotine patch for her and     recommended that she discontinue smoking as this is one of her risk     factors for stroke and heart attacks. 7. Morbid obesity.  She is working on trying to lose weight. 8. I will be starting a baby aspirin for her, which she will need to     continue.  Time on admission was 50 minutes.     Calvert Cantor, M.D.     SR/MEDQ  D:  10/15/2010  T:  10/15/2010  Job:  [id number]  cc:   Georgette Shell, PA  Electronically Signed by Calvert Cantor M.D. on 11/19/2010 07:15:21 PM

## 2011-05-05 DIAGNOSIS — Z8673 Personal history of transient ischemic attack (TIA), and cerebral infarction without residual deficits: Secondary | ICD-10-CM | POA: Insufficient documentation

## 2016-08-20 DIAGNOSIS — E785 Hyperlipidemia, unspecified: Secondary | ICD-10-CM | POA: Insufficient documentation

## 2016-08-20 DIAGNOSIS — E1169 Type 2 diabetes mellitus with other specified complication: Secondary | ICD-10-CM | POA: Insufficient documentation

## 2016-09-08 DIAGNOSIS — J449 Chronic obstructive pulmonary disease, unspecified: Secondary | ICD-10-CM | POA: Insufficient documentation

## 2016-09-08 DIAGNOSIS — E785 Hyperlipidemia, unspecified: Secondary | ICD-10-CM | POA: Insufficient documentation

## 2016-09-08 DIAGNOSIS — F32A Depression, unspecified: Secondary | ICD-10-CM | POA: Insufficient documentation

## 2017-07-02 ENCOUNTER — Encounter (HOSPITAL_COMMUNITY): Payer: Self-pay

## 2017-07-02 ENCOUNTER — Inpatient Hospital Stay (HOSPITAL_COMMUNITY)
Admission: EM | Admit: 2017-07-02 | Discharge: 2017-07-05 | DRG: 603 | Disposition: A | Discharge status: Home / Self Care | Payer: BC Managed Care – PPO | Attending: Internal Medicine | Admitting: Internal Medicine

## 2017-07-02 ENCOUNTER — Other Ambulatory Visit: Payer: Self-pay

## 2017-07-02 DIAGNOSIS — K219 Gastro-esophageal reflux disease without esophagitis: Secondary | ICD-10-CM | POA: Diagnosis present

## 2017-07-02 DIAGNOSIS — Z888 Allergy status to other drugs, medicaments and biological substances status: Secondary | ICD-10-CM

## 2017-07-02 DIAGNOSIS — L230 Allergic contact dermatitis due to metals: Secondary | ICD-10-CM

## 2017-07-02 DIAGNOSIS — IMO0002 Reserved for concepts with insufficient information to code with codable children: Secondary | ICD-10-CM

## 2017-07-02 DIAGNOSIS — L03211 Cellulitis of face: Principal | ICD-10-CM | POA: Diagnosis present

## 2017-07-02 DIAGNOSIS — L282 Other prurigo: Secondary | ICD-10-CM

## 2017-07-02 DIAGNOSIS — Z79899 Other long term (current) drug therapy: Secondary | ICD-10-CM

## 2017-07-02 DIAGNOSIS — Z23 Encounter for immunization: Secondary | ICD-10-CM

## 2017-07-02 DIAGNOSIS — I10 Essential (primary) hypertension: Secondary | ICD-10-CM | POA: Diagnosis present

## 2017-07-02 DIAGNOSIS — E1140 Type 2 diabetes mellitus with diabetic neuropathy, unspecified: Secondary | ICD-10-CM

## 2017-07-02 DIAGNOSIS — L2481 Irritant contact dermatitis due to metals: Secondary | ICD-10-CM | POA: Diagnosis present

## 2017-07-02 DIAGNOSIS — F329 Major depressive disorder, single episode, unspecified: Secondary | ICD-10-CM | POA: Diagnosis present

## 2017-07-02 DIAGNOSIS — E039 Hypothyroidism, unspecified: Secondary | ICD-10-CM | POA: Diagnosis present

## 2017-07-02 DIAGNOSIS — F419 Anxiety disorder, unspecified: Secondary | ICD-10-CM | POA: Diagnosis present

## 2017-07-02 DIAGNOSIS — E785 Hyperlipidemia, unspecified: Secondary | ICD-10-CM | POA: Diagnosis present

## 2017-07-02 DIAGNOSIS — Z881 Allergy status to other antibiotic agents status: Secondary | ICD-10-CM

## 2017-07-02 DIAGNOSIS — E1159 Type 2 diabetes mellitus with other circulatory complications: Secondary | ICD-10-CM | POA: Diagnosis present

## 2017-07-02 DIAGNOSIS — E119 Type 2 diabetes mellitus without complications: Secondary | ICD-10-CM | POA: Diagnosis present

## 2017-07-02 DIAGNOSIS — T7840XA Allergy, unspecified, initial encounter: Secondary | ICD-10-CM

## 2017-07-02 LAB — CBC WITH DIFFERENTIAL/PLATELET
Abs Immature Granulocytes: 0.1 10*3/uL (ref 0.0–0.1)
Basophils Absolute: 0.1 10*3/uL (ref 0.0–0.1)
Basophils Relative: 1 %
Eosinophils Absolute: 0.5 10*3/uL (ref 0.0–0.7)
Eosinophils Relative: 4 %
HCT: 49 % — ABNORMAL HIGH (ref 36.0–46.0)
Hemoglobin: 16.2 g/dL — ABNORMAL HIGH (ref 12.0–15.0)
Immature Granulocytes: 1 %
Lymphocytes Relative: 28 %
Lymphs Abs: 3.7 10*3/uL (ref 0.7–4.0)
MCH: 31.2 pg (ref 26.0–34.0)
MCHC: 33.1 g/dL (ref 30.0–36.0)
MCV: 94.4 fL (ref 78.0–100.0)
Monocytes Absolute: 1 10*3/uL (ref 0.1–1.0)
Monocytes Relative: 8 %
Neutro Abs: 7.5 10*3/uL (ref 1.7–7.7)
Neutrophils Relative %: 58 %
Platelets: 348 10*3/uL (ref 150–400)
RBC: 5.19 MIL/uL — ABNORMAL HIGH (ref 3.87–5.11)
RDW: 13.5 % (ref 11.5–15.5)
WBC: 12.9 10*3/uL — ABNORMAL HIGH (ref 4.0–10.5)

## 2017-07-02 LAB — I-STAT BETA HCG BLOOD, ED (MC, WL, AP ONLY): I-stat hCG, quantitative: 5.2 m[IU]/mL — ABNORMAL HIGH (ref <5)

## 2017-07-02 LAB — BASIC METABOLIC PANEL
Anion gap: 12 (ref 5–15)
BUN: 16 mg/dL (ref 6–20)
CO2: 25 mmol/L (ref 22–32)
Calcium: 10 mg/dL (ref 8.9–10.3)
Chloride: 103 mmol/L (ref 101–111)
Creatinine, Ser: 0.95 mg/dL (ref 0.44–1.00)
GFR calc Af Amer: >60 mL/min (ref >60)
GFR calc non Af Amer: >60 mL/min (ref >60)
Glucose, Bld: 164 mg/dL — ABNORMAL HIGH (ref 65–99)
Potassium: 4.1 mmol/L (ref 3.5–5.1)
Sodium: 140 mmol/L (ref 135–145)

## 2017-07-02 MED ORDER — ACETAMINOPHEN 325 MG PO TABS
650.0000 mg | ORAL_TABLET | Freq: Once | ORAL | Status: AC
  Administered 2017-07-02: 650 mg via ORAL
  Filled 2017-07-02: qty 2

## 2017-07-02 MED ORDER — CLINDAMYCIN PHOSPHATE 600 MG/50ML IV SOLN
600.0000 mg | Freq: Once | INTRAVENOUS | Status: AC
  Administered 2017-07-02: 600 mg via INTRAVENOUS
  Filled 2017-07-02: qty 50

## 2017-07-02 NOTE — ED Provider Notes (Signed)
[facility] EMERGENCY DEPARTMENT Provider Note   CSN: [id number] Arrival date & time: 07/02/17  1753     History   Chief Complaint No chief complaint on file.   HPI [name] is a 52 y.o. female.  The history is provided by the patient and medical records.     52 year old female with history of anxiety, depression, diabetes, GERD, hypertension, hyperlipidemia, hypothyroidism, presenting to the ED with right facial cellulitis.  Patient reports this began on Tuesday, woke up and noticed some mild redness of the right side of her face.  She went and saw her primary care doctor and was given shot of Rocephin in the office and started on doxycycline.  She has been to the doctor almost every day this week and there is been no noted improvement, states she is actually starting to notice worsening redness and swelling.  She has been having some drainage as well, mostly from the side of her nose.  She denies any pain of the area of redness, states it just feels puffy and like pressure in her face.  No dental issues.  No lip/tongue swelling.  No difficulty swallowing.  No hx of cellulitis in the past.  States her blood sugars have been a little elevated lately because of infection.  Past Medical History:  Diagnosis Date  . Anxiety and depression   . Diabetes mellitus   . GERD (gastroesophageal reflux disease)   . HTN (hypertension)   . Hyperlipidemia   . Hypothyroidism     Patient Active Problem List   Diagnosis Date Noted  . Dyspnea 07/10/2010  . Tobacco abuse 07/10/2010    Past Surgical History:  Procedure Laterality Date  . Ankle surgery x 2    . Exploratory Laparoscopy    . PARTIAL HYSTERECTOMY       OB History   None      Home Medications    Prior to Admission medications   Medication Sig Start Date End Date Taking? Authorizing Provider  fish oil-omega-3 fatty acids 1000 MG capsule Take 2 g by mouth daily.      [provider]    glipiZIDE (GLUCOTROL) 10 MG 24 hr tablet Take 10 mg by mouth daily.      [provider]  levothyroxine (SYNTHROID, LEVOTHROID) 200 MCG tablet Take 200 mcg by mouth daily.      [provider]  metFORMIN (GLUCOPHAGE-XR) 500 MG 24 hr tablet Take 500 mg by mouth daily with breakfast.      [provider]  potassium chloride (K-DUR,KLOR-CON) 10 MEQ tablet Take 10 mEq by mouth 2 (two) times daily.      [provider]  simvastatin (ZOCOR) 40 MG tablet Take 40 mg by mouth at bedtime.      [provider]  sulfamethoxazole-trimethoprim (BACTRIM DS) 800-160 MG per tablet Take 1 tablet by mouth 2 (two) times daily.      [provider]  torsemide (DEMADEX) 10 MG tablet Take 10 mg by mouth daily.      [provider]  valsartan-hydrochlorothiazide (DIOVAN-HCT) 160-25 MG per tablet Take 1 tablet by mouth daily.      [provider]    Family History Family History  Problem Relation Age of Onset  . Heart attack Father   . Heart attack Paternal Uncle   . Heart attack Maternal Grandfather   . Heart attack Paternal Grandfather     Social History Social History   Tobacco Use  .  Smoking status: Current Every Day Smoker    Packs/day: 2.00    Types: Cigarettes  Substance Use Topics  . Alcohol use: Yes    Alcohol/week: 0.5 oz    Types: 1 Standard drinks or equivalent per week  . Drug use: No     Allergies   Compazine and Lasix [furosemide]   Review of Systems Review of Systems  HENT: Positive for facial swelling.   Skin: Positive for color change.  All other systems reviewed and are negative.    Physical Exam Updated Vital Signs BP 102/75 (BP Location: Right Arm)   Pulse 96   Temp 98.1 F (36.7 C) (Oral)   Resp 16   Ht 5\' 3"  (1.6 m)   Wt 98 kg (216 lb)   SpO2 94%   BMI 38.26 kg/m   Physical Exam  Constitutional: She is oriented to person, place, and time. She appears well-developed and well-nourished.   HENT:  Head: Normocephalic and atraumatic.  Mouth/Throat: Oropharynx is clear and moist.  Swelling of the right side of the face as depicted, there is no apparent fluctuance; there is some dryness/flakiness and crusting of the skin along right side of nose and beneath right eye; no developed vesicles or discrete lesions noted; some mild erythema developing behind right ear; dentition overall normal, no signs of dental infection/abscess; handling secretions well; no oral swelling or airway compromise  Eyes: Pupils are equal, round, and reactive to light. Conjunctivae and EOM are normal.  Neck: Normal range of motion.  Cardiovascular: Normal rate, regular rhythm and normal heart sounds.  Pulmonary/Chest: Effort normal and breath sounds normal. No stridor. No respiratory distress.  Abdominal: Soft. Bowel sounds are normal. There is no tenderness. There is no rebound.  Musculoskeletal: Normal range of motion.  Lymphadenopathy:    She has cervical adenopathy (right).  Neurological: She is alert and oriented to person, place, and time.  Skin: Skin is warm and dry.  Psychiatric: She has a normal mood and affect.  Nursing note and vitals reviewed.         ED Treatments / Results  Labs (all labs ordered are listed, but only abnormal results are displayed) Labs Reviewed  BASIC METABOLIC PANEL - Abnormal; Notable for the following components:      Result Value   Glucose, Bld 164 (*)    All other components within normal limits  CBC WITH DIFFERENTIAL/PLATELET - Abnormal; Notable for the following components:   WBC 12.9 (*)    RBC 5.19 (*)    Hemoglobin 16.2 (*)    HCT 49.0 (*)    All other components within normal limits  I-STAT BETA HCG BLOOD, ED ([facility], [facility], [facility] ONLY) - Abnormal; Notable for the following components:   I-stat hCG, quantitative 5.2 (*)    All other components within normal limits    EKG None  Radiology Ct Maxillofacial W Contrast  Result Date: 07/03/2017 CLINICAL  DATA:  Four days of swollen right face. EXAM: CT MAXILLOFACIAL WITH CONTRAST TECHNIQUE: Multidetector CT imaging of the maxillofacial structures was performed with intravenous contrast. Multiplanar CT image reconstructions were also generated. CONTRAST:  75mL OMNIPAQUE IOHEXOL 300 MG/ML  SOLN COMPARISON:  10/17/2010 MRI of the head. FINDINGS: Osseous: No fracture or mandibular dislocation. No destructive process. Orbits: Negative. No traumatic or inflammatory finding. Sinuses: Clear. Soft tissues: There is edema within the subcutaneous fat of the right face. No discrete rim enhancing abscess. Limited intracranial: No significant or unexpected finding. IMPRESSION: Edema within subcutaneous fat of  the right face which may be infectious or inflammatory. No discrete rim enhancing abscess or extension to the deep cervical compartments. Electronically Signed   By: Mitzi Hansen M.D.   On: 07/03/2017 00:37    Procedures Procedures (including critical care time)  Medications Ordered in ED Medications  Dulaglutide SOPN 1.5 mg (has no administration in time range)  fenofibrate tablet 160 mg (has no administration in time range)  levothyroxine (SYNTHROID, LEVOTHROID) tablet 200 mcg (has no administration in time range)  sertraline (ZOLOFT) tablet 50 mg (has no administration in time range)  simvastatin (ZOCOR) tablet 40 mg (40 mg Oral Given 07/03/17 0133)  fluticasone furoate-vilanterol (BREO ELLIPTA) 200-25 MCG/INH 1 puff (has no administration in time range)  insulin aspart (novoLOG) injection 0-9 Units (has no administration in time range)  acetaminophen (TYLENOL) tablet 650 mg (has no administration in time range)    Or  acetaminophen (TYLENOL) suppository 650 mg (has no administration in time range)  ondansetron (ZOFRAN) tablet 4 mg (has no administration in time range)    Or  ondansetron (ZOFRAN) injection 4 mg (has no administration in time range)  enoxaparin (LOVENOX) injection 40 mg (has  no administration in time range)  linezolid (ZYVOX) tablet 600 mg (has no administration in time range)  irbesartan (AVAPRO) tablet 150 mg (has no administration in time range)    And  hydrochlorothiazide (HYDRODIURIL) tablet 25 mg (has no administration in time range)  clindamycin (CLEOCIN) IVPB 600 mg (0 mg Intravenous Stopped 07/02/17 2357)  acetaminophen (TYLENOL) tablet 650 mg (650 mg Oral Given 07/02/17 2345)  iohexol (OMNIPAQUE) 300 MG/ML solution 75 mL (75 mLs Intravenous Contrast Given 07/03/17 0010)  vancomycin (VANCOCIN) 2,000 mg in sodium chloride 0.9 % 500 mL IVPB (0 mg Intravenous Stopped 07/03/17 0125)     Initial Impression / Assessment and Plan / ED Course  I have reviewed the triage vital signs and the nursing notes.  Pertinent labs & imaging results that were available during my care of the patient were reviewed by me and considered in my medical decision making (see chart for details).  51 year old female here with right-sided facial cellulitis that has not responded to IM Rocephin or doxycycline.  She is afebrile and nontoxic in appearance.  Areas of the nose do have some honey colored crusting which seem to resemble impetigo somewhat.  There is no oral involvement.  I do not appreciate any fluctuance or discrete abscess formation.  Labs sent from triage are overall reassuring, mild leukocytosis.  Given lack of improvement, CT scan of the face was obtained and is consistent with cellulitis, no deep space infection of the face.  She was treated here with IV clindamycin.  Will admit for further IV abx.  Discussed with Dr. Beacher May-- will admit.  Final Clinical Impressions(s) / ED Diagnoses   Final diagnoses:  Facial cellulitis    ED Discharge Orders    None       Garlon Hatchet, PA-C 07/03/17 8119    Little, Ambrose Finland, MD 07/06/17 [phone]

## 2017-07-02 NOTE — ED Notes (Signed)
Lorrene Reid is family contact @ [phone]

## 2017-07-02 NOTE — ED Triage Notes (Signed)
Pt from home; pt has facial infection since Tuesday; pt received IM antibiotics Wednesday and Thursday w/ no improvement; pt's MD told pt to come to ED;  hx diabetes, HTN; started on R side of nose, spread across R cheek; not painful, oozing clear fluid, red, warm to touch

## 2017-07-02 NOTE — ED Provider Notes (Signed)
Patient placed in Quick Look pathway, seen and evaluated   Chief Complaint: Facial infection  HPI:   Patient presents with history of acute onset, aggressively worsening redness and swelling of the right side of the face since Tuesday, 4 days ago.  She has been seen and evaluated by her primary care physician yesterday and the day before and received IM antibiotics as well as started on p.o. doxycycline.  She states that the redness has been spreading and today she noticed some redness behind the right ear.  She notes mild pain to the right ear and mild tenderness to palpation of the right lower eyelid but no pain otherwise.  She denies vision changes, fevers, vomiting, or pain with eye movements.  She endorses lymphadenopathy but denies throat tightness, drooling.  Has a history of diabetes but is unsure if her blood sugars have been running high.  ROS: Positive for rash, negative for eye pain, vision changes, fevers, redness, nausea, vomiting, drooling  Physical Exam:   Gen: No distress  Neuro: Awake and Alert  Skin: Warm    Focused Exam: See attached images.  No pain with EOMs, TMs without erythema or bulging bilaterally.  No tenderness to palpation of the mastoids.  There is a crusty draining rash with some surrounding erythema and swelling.  Patient tolerating secretions without difficulty.  Lung sounds globally diminished but otherwise clear to auscultation, speaking in full sentences without difficulty peer       Initiation of care has begun. The patient has been counseled on the process, plan, and necessity for staying for the completion/evaluation, and the remainder of the medical screening examination    Bennye AlmFawze, Shelisha Gautier A, PA-C 07/02/17 1900    Cathren LaineSteinl, Kevin, MD 07/02/17 2253

## 2017-07-03 ENCOUNTER — Encounter (HOSPITAL_COMMUNITY): Payer: Self-pay | Admitting: Radiology

## 2017-07-03 ENCOUNTER — Emergency Department (HOSPITAL_COMMUNITY): Payer: BC Managed Care – PPO

## 2017-07-03 DIAGNOSIS — L230 Allergic contact dermatitis due to metals: Secondary | ICD-10-CM | POA: Diagnosis not present

## 2017-07-03 DIAGNOSIS — F329 Major depressive disorder, single episode, unspecified: Secondary | ICD-10-CM | POA: Diagnosis present

## 2017-07-03 DIAGNOSIS — E119 Type 2 diabetes mellitus without complications: Secondary | ICD-10-CM | POA: Diagnosis not present

## 2017-07-03 DIAGNOSIS — Z23 Encounter for immunization: Secondary | ICD-10-CM | POA: Diagnosis not present

## 2017-07-03 DIAGNOSIS — Z79899 Other long term (current) drug therapy: Secondary | ICD-10-CM | POA: Diagnosis not present

## 2017-07-03 DIAGNOSIS — L2481 Irritant contact dermatitis due to metals: Secondary | ICD-10-CM | POA: Diagnosis present

## 2017-07-03 DIAGNOSIS — F419 Anxiety disorder, unspecified: Secondary | ICD-10-CM | POA: Diagnosis present

## 2017-07-03 DIAGNOSIS — K219 Gastro-esophageal reflux disease without esophagitis: Secondary | ICD-10-CM | POA: Diagnosis present

## 2017-07-03 DIAGNOSIS — L282 Other prurigo: Secondary | ICD-10-CM | POA: Diagnosis not present

## 2017-07-03 DIAGNOSIS — I10 Essential (primary) hypertension: Secondary | ICD-10-CM | POA: Diagnosis not present

## 2017-07-03 DIAGNOSIS — Z888 Allergy status to other drugs, medicaments and biological substances status: Secondary | ICD-10-CM

## 2017-07-03 DIAGNOSIS — L03211 Cellulitis of face: Secondary | ICD-10-CM | POA: Diagnosis not present

## 2017-07-03 DIAGNOSIS — E1140 Type 2 diabetes mellitus with diabetic neuropathy, unspecified: Secondary | ICD-10-CM

## 2017-07-03 DIAGNOSIS — E039 Hypothyroidism, unspecified: Secondary | ICD-10-CM | POA: Diagnosis present

## 2017-07-03 DIAGNOSIS — IMO0002 Reserved for concepts with insufficient information to code with codable children: Secondary | ICD-10-CM

## 2017-07-03 DIAGNOSIS — E785 Hyperlipidemia, unspecified: Secondary | ICD-10-CM | POA: Diagnosis present

## 2017-07-03 DIAGNOSIS — Z881 Allergy status to other antibiotic agents status: Secondary | ICD-10-CM | POA: Diagnosis not present

## 2017-07-03 DIAGNOSIS — E1159 Type 2 diabetes mellitus with other circulatory complications: Secondary | ICD-10-CM | POA: Diagnosis present

## 2017-07-03 LAB — GLUCOSE, CAPILLARY
GLUCOSE-CAPILLARY: 208 mg/dL — AB (ref 65–99)
GLUCOSE-CAPILLARY: 234 mg/dL — AB (ref 65–99)
Glucose-Capillary: 166 mg/dL — ABNORMAL HIGH (ref 65–99)

## 2017-07-03 LAB — BASIC METABOLIC PANEL
Anion gap: 12 (ref 5–15)
BUN: 16 mg/dL (ref 6–20)
CALCIUM: 9.2 mg/dL (ref 8.9–10.3)
CO2: 24 mmol/L (ref 22–32)
CREATININE: 0.71 mg/dL (ref 0.44–1.00)
Chloride: 101 mmol/L (ref 101–111)
GFR calc non Af Amer: >60 mL/min (ref >60)
Glucose, Bld: 200 mg/dL — ABNORMAL HIGH (ref 65–99)
Potassium: 4.1 mmol/L (ref 3.5–5.1)
SODIUM: 137 mmol/L (ref 135–145)

## 2017-07-03 MED ORDER — SERTRALINE HCL 50 MG PO TABS
50.0000 mg | ORAL_TABLET | Freq: Every day | ORAL | Status: DC
Start: 2017-07-03 — End: 2017-07-05
  Administered 2017-07-03 – 2017-07-05 (×3): 50 mg via ORAL
  Filled 2017-07-03 (×3): qty 1

## 2017-07-03 MED ORDER — INSULIN ASPART 100 UNIT/ML ~~LOC~~ SOLN
0.0000 [IU] | Freq: Three times a day (TID) | SUBCUTANEOUS | Status: DC
  Administered 2017-07-03: 3 [IU] via SUBCUTANEOUS
  Administered 2017-07-03 – 2017-07-04 (×2): 2 [IU] via SUBCUTANEOUS
  Administered 2017-07-04: 1 [IU] via SUBCUTANEOUS
  Administered 2017-07-04 – 2017-07-05 (×2): 2 [IU] via SUBCUTANEOUS

## 2017-07-03 MED ORDER — SIMVASTATIN 40 MG PO TABS
40.0000 mg | ORAL_TABLET | Freq: Every day | ORAL | Status: DC
  Administered 2017-07-03 – 2017-07-04 (×3): 40 mg via ORAL
  Filled 2017-07-03 (×3): qty 1

## 2017-07-03 MED ORDER — FLUTICASONE FUROATE-VILANTEROL 200-25 MCG/INH IN AEPB
1.0000 | INHALATION_SPRAY | Freq: Every day | RESPIRATORY_TRACT | Status: DC
  Administered 2017-07-03 – 2017-07-05 (×3): 1 via RESPIRATORY_TRACT
  Filled 2017-07-03: qty 28

## 2017-07-03 MED ORDER — LEVOTHYROXINE SODIUM 200 MCG PO TABS
200.0000 ug | ORAL_TABLET | Freq: Every day | ORAL | Status: DC
  Administered 2017-07-03 – 2017-07-05 (×3): 200 ug via ORAL
  Filled 2017-07-03 (×2): qty 2
  Filled 2017-07-03: qty 1
  Filled 2017-07-03: qty 2
  Filled 2017-07-03 (×2): qty 1

## 2017-07-03 MED ORDER — DULAGLUTIDE 1.5 MG/0.5ML ~~LOC~~ SOAJ: 1.5000 mg | SUBCUTANEOUS | Status: DC

## 2017-07-03 MED ORDER — IOHEXOL 300 MG/ML  SOLN
75.0000 mL | Freq: Once | INTRAMUSCULAR | Status: AC | PRN
  Administered 2017-07-03: 75 mL via INTRAVENOUS

## 2017-07-03 MED ORDER — INSULIN GLARGINE 100 UNIT/ML ~~LOC~~ SOLN
5.0000 [IU] | Freq: Every day | SUBCUTANEOUS | Status: DC
  Administered 2017-07-03 – 2017-07-04 (×2): 5 [IU] via SUBCUTANEOUS
  Filled 2017-07-03 (×2): qty 0.05

## 2017-07-03 MED ORDER — ENOXAPARIN SODIUM 40 MG/0.4ML ~~LOC~~ SOLN
40.0000 mg | SUBCUTANEOUS | Status: DC
  Administered 2017-07-03: 40 mg via SUBCUTANEOUS
  Filled 2017-07-03 (×2): qty 0.4

## 2017-07-03 MED ORDER — VANCOMYCIN HCL 10 G IV SOLR
2000.0000 mg | Freq: Once | INTRAVENOUS | Status: AC
  Administered 2017-07-03: 2000 mg via INTRAVENOUS
  Filled 2017-07-03: qty 2000

## 2017-07-03 MED ORDER — FENOFIBRATE 160 MG PO TABS
160.0000 mg | ORAL_TABLET | Freq: Every day | ORAL | Status: DC
  Administered 2017-07-03 – 2017-07-05 (×3): 160 mg via ORAL
  Filled 2017-07-03 (×3): qty 1

## 2017-07-03 MED ORDER — NICOTINE 21 MG/24HR TD PT24
21.0000 mg | MEDICATED_PATCH | Freq: Every day | TRANSDERMAL | Status: DC
  Administered 2017-07-03: 21 mg via TRANSDERMAL
  Filled 2017-07-03 (×3): qty 1

## 2017-07-03 MED ORDER — HYDROCHLOROTHIAZIDE 25 MG PO TABS
25.0000 mg | ORAL_TABLET | Freq: Every day | ORAL | Status: DC
  Administered 2017-07-03 – 2017-07-05 (×3): 25 mg via ORAL
  Filled 2017-07-03 (×3): qty 1

## 2017-07-03 MED ORDER — ACETAMINOPHEN 325 MG PO TABS
650.0000 mg | ORAL_TABLET | Freq: Four times a day (QID) | ORAL | Status: DC | PRN
  Administered 2017-07-03 – 2017-07-05 (×3): 650 mg via ORAL
  Filled 2017-07-03 (×4): qty 2

## 2017-07-03 MED ORDER — CEFAZOLIN SODIUM-DEXTROSE 1-4 GM/50ML-% IV SOLN
1.0000 g | Freq: Three times a day (TID) | INTRAVENOUS | Status: DC
  Administered 2017-07-03 – 2017-07-05 (×6): 1 g via INTRAVENOUS
  Filled 2017-07-03 (×7): qty 50

## 2017-07-03 MED ORDER — IRBESARTAN 150 MG PO TABS
150.0000 mg | ORAL_TABLET | Freq: Every day | ORAL | Status: DC
  Administered 2017-07-03 – 2017-07-05 (×3): 150 mg via ORAL
  Filled 2017-07-03 (×3): qty 1

## 2017-07-03 MED ORDER — VALSARTAN-HYDROCHLOROTHIAZIDE 160-25 MG PO TABS: 1.0000 | ORAL_TABLET | Freq: Every day | ORAL | Status: DC

## 2017-07-03 MED ORDER — CLINDAMYCIN PHOSPHATE 600 MG/50ML IV SOLN: 600.0000 mg | Freq: Three times a day (TID) | INTRAVENOUS | Status: DC

## 2017-07-03 MED ORDER — VANCOMYCIN HCL IN DEXTROSE 1-5 GM/200ML-% IV SOLN: 1000.0000 mg | Freq: Two times a day (BID) | INTRAVENOUS | Status: DC

## 2017-07-03 MED ORDER — LINEZOLID 600 MG PO TABS
600.0000 mg | ORAL_TABLET | Freq: Once | ORAL | Status: AC
  Administered 2017-07-03: 600 mg via ORAL
  Filled 2017-07-03: qty 1

## 2017-07-03 MED ORDER — ACETAMINOPHEN 650 MG RE SUPP: 650.0000 mg | Freq: Four times a day (QID) | RECTAL | Status: DC | PRN

## 2017-07-03 MED ORDER — ONDANSETRON HCL 4 MG PO TABS: 4.0000 mg | ORAL_TABLET | Freq: Four times a day (QID) | ORAL | Status: DC | PRN

## 2017-07-03 MED ORDER — CLINDAMYCIN PHOSPHATE 600 MG/50ML IV SOLN
600.0000 mg | Freq: Three times a day (TID) | INTRAVENOUS | Status: DC
  Administered 2017-07-03 – 2017-07-05 (×6): 600 mg via INTRAVENOUS
  Filled 2017-07-03 (×7): qty 50

## 2017-07-03 MED ORDER — ONDANSETRON HCL 4 MG/2ML IJ SOLN: 4.0000 mg | Freq: Four times a day (QID) | INTRAMUSCULAR | Status: DC | PRN

## 2017-07-03 NOTE — ED Notes (Signed)
Pt. Return from CT via stretcher. 

## 2017-07-03 NOTE — ED Notes (Signed)
Pt. To CT via stretcher. 

## 2017-07-03 NOTE — Progress Notes (Signed)
Patient developed erythema, itching, swelling of arm at and just proximal to vanc injection site a mere 2 mins into the IV.  IV stopped.  PharmD and I evaluated at bedside.  Both PharmD and I feel that this looks like a true allergic reaction (as opposed to red-mans syndrome).  Thankfully reaction symptoms improved slowly over next few mins while off of vanc without requiring further intervention.  At this point PharmD recommends giving one time dose of LZD and getting ID consult in AM.  Will order one time dose of Linezolid 600mg  PO.  Patient needs ID consult in AM.

## 2017-07-03 NOTE — H&P (Signed)
History and Physical    Michelle CurtKimberly M Barbian ONG:[id number] DOB: 06/30/1965 DOA: 07/02/2017  PCP: Patient, No Pcp Per  Patient coming from: Home  I have personally briefly reviewed patient's [facility]  Chief Complaint: Facial cellulitis  HPI: [name] is a 52 y.o. female with medical history significant of DM2, HTN.  Patient presents to the ED with worsening facial cellulitis.  Initially seen by PCP on 5/29 with this.  She was given IM rocephin and started on 10 day course of doxycycline.  Repeat IM rocephin on 5/30.  Seemed to be improving initially but then was clearly worse on recheck yesterday 5/31.  Patient was sent in to ED by PCP.  Did have skin swab culture done on 5/29 results of which are still pending.   ED Course: WBC 12.9k.  No other SIRS.  Given clindamycin by EDP.  Hospitalist asked to admit.   Review of Systems: As per HPI otherwise 10 point review of systems negative.   Past Medical History:  Diagnosis Date  . Anxiety and depression   . Diabetes mellitus   . GERD (gastroesophageal reflux disease)   . HTN (hypertension)   . Hyperlipidemia   . Hypothyroidism     Past Surgical History:  Procedure Laterality Date  . Ankle surgery x 2    . Exploratory Laparoscopy    . PARTIAL HYSTERECTOMY       reports that she has been smoking cigarettes.  She has been smoking about 2.00 packs per day. She does not have any smokeless tobacco history on file. She reports that she drinks about 0.5 oz of alcohol per week. She reports that she does not use drugs.  Allergies  Allergen Reactions  . Compazine   . Lasix [Furosemide]     BREAKS OUT IN [facility]ES    Family History  Problem Relation Age of Onset  . Heart attack Father   . Heart attack Paternal Uncle   . Heart attack Maternal Grandfather   . Heart attack Paternal Grandfather      Prior to Admission medications   Medication Sig Start Date End Date Taking? Authorizing Provider    Dulaglutide (TRULICITY) 1.5 MG/0.5ML SOPN Inject 1.5 mg into the skin once a week. On Sunday 08/20/16 08/20/17 Yes [provider]  fenofibrate 160 MG tablet Take 160 mg by mouth daily. 06/28/17  Yes [provider]  levothyroxine (SYNTHROID, LEVOTHROID) 200 MCG tablet Take 200 mcg by mouth daily.     Yes [provider]  metFORMIN (GLUCOPHAGE) 500 MG tablet Take 1,000 mg by mouth 2 (two) times daily with a meal.   Yes [provider]  sertraline (ZOLOFT) 50 MG tablet Take 50 mg by mouth daily. 02/26/17  Yes [provider]  simvastatin (ZOCOR) 40 MG tablet Take 40 mg by mouth at bedtime.     Yes [provider]  SYMBICORT 160-4.5 MCG/ACT inhaler Inhale 2 puffs into the lungs 2 (two) times daily.  05/15/17  Yes [provider]  valsartan-hydrochlorothiazide (DIOVAN-HCT) 160-25 MG per tablet Take 1 tablet by mouth daily.     Yes [provider]    Physical Exam: Vitals:   07/02/17 1906 07/02/17 2209 07/02/17 2324 07/02/17 2345  BP:  102/75 93/71 113/84  Pulse:  96 90 98  Resp:  16    Temp:  98.1 F (36.7 C)    TempSrc:  Oral    SpO2:  94% 95% 93%  Weight: 98 kg (  216 lb)     Height: 5\' 3"  (1.6 m)       Constitutional: NAD, calm, comfortable Eyes: PERRL, lids and conjunctivae normal ENMT: Mucous membranes are moist. Posterior pharynx clear of any exudate or lesions.Normal dentition.  Neck: normal, supple, no masses, no thyromegaly Respiratory: clear to auscultation bilaterally, no wheezing, no crackles. Normal respiratory effort. No accessory muscle use.  Cardiovascular: Regular rate and rhythm, no murmurs / rubs / gallops. No extremity edema. 2+ pedal pulses. No carotid bruits.  Abdomen: no tenderness, no masses palpated. No hepatosplenomegaly. Bowel sounds positive.  Musculoskeletal: no clubbing / cyanosis. No joint deformity upper and lower extremities. Good ROM, no contractures. Normal muscle tone.  Skin:       Neurologic: CN 2-12 grossly intact. Sensation intact, DTR normal. Strength 5/5 in all 4.  Psychiatric: Normal judgment and insight. Alert and oriented x 3. Normal mood.    Labs on Admission: I have personally reviewed following labs and imaging studies  CBC: Recent Labs  Lab 07/02/17 1905  WBC 12.9*  NEUTROABS 7.5  HGB 16.2*  HCT 49.0*  MCV 94.4  PLT 348   Basic Metabolic Panel: Recent Labs  Lab 07/02/17 1905  NA 140  K 4.1  CL 103  CO2 25  GLUCOSE 164*  BUN 16  CREATININE 0.95  CALCIUM 10.0   GFR: Estimated Creatinine Clearance: 75.5 mL/min (by C-G formula based on SCr of 0.95 mg/dL). Liver Function Tests: No results for input(s): AST, ALT, ALKPHOS, BILITOT, PROT, ALBUMIN in the last 168 hours. No results for input(s): LIPASE, AMYLASE in the last 168 hours. No results for input(s): AMMONIA in the last 168 hours. Coagulation Profile: No results for input(s): INR, PROTIME in the last 168 hours. Cardiac Enzymes: No results for input(s): CKTOTAL, CKMB, CKMBINDEX, TROPONINI in the last 168 hours. BNP (last 3 results) No results for input(s): PROBNP in the last 8760 hours. HbA1C: No results for input(s): HGBA1C in the last 72 hours. CBG: No results for input(s): GLUCAP in the last 168 hours. Lipid Profile: No results for input(s): CHOL, HDL, LDLCALC, TRIG, CHOLHDL, LDLDIRECT in the last 72 hours. Thyroid Function Tests: No results for input(s): TSH, T4TOTAL, FREET4, T3FREE, THYROIDAB in the last 72 hours. Anemia Panel: No results for input(s): VITAMINB12, FOLATE, FERRITIN, TIBC, IRON, RETICCTPCT in the last 72 hours. Urine analysis: No results found for: COLORURINE, APPEARANCEUR, LABSPEC, PHURINE, GLUCOSEU, HGBUR, BILIRUBINUR, KETONESUR, PROTEINUR, UROBILINOGEN, NITRITE, LEUKOCYTESUR  Radiological Exams on Admission: Ct Maxillofacial W Contrast  Result Date: 07/03/2017 CLINICAL DATA:  Four days of swollen right face. EXAM: CT MAXILLOFACIAL WITH CONTRAST  TECHNIQUE: Multidetector CT imaging of the maxillofacial structures was performed with intravenous contrast. Multiplanar CT image reconstructions were also generated. CONTRAST:  75mL OMNIPAQUE IOHEXOL 300 MG/ML  SOLN COMPARISON:  10/17/2010 MRI of the head. FINDINGS: Osseous: No fracture or mandibular dislocation. No destructive process. Orbits: Negative. No traumatic or inflammatory finding. Sinuses: Clear. Soft tissues: There is edema within the subcutaneous fat of the right face. No discrete rim enhancing abscess. Limited intracranial: No significant or unexpected finding. IMPRESSION: Edema within subcutaneous fat of the right face which may be infectious or inflammatory. No discrete rim enhancing abscess or extension to the deep cervical compartments. Electronically Signed   By: Mitzi Hansen M.D.   On: 07/03/2017 00:37    EKG: Independently reviewed.  Assessment/Plan Principal Problem:   Cellulitis of face Active Problems:   DM2 (diabetes mellitus, type 2) (HCC)   HTN (hypertension)    1.  Cellulitis of face - worsening 1. Failed outpatient PO doxycycline AND 2 injections of IM Rocephin 2. Involves "triangle of danger" area (nose) 3. Thus I feel escalation to vancomycin is indicated and justified at this point for MRSA coverage. 4. Daily BMP while on vanc 5. If no improvement with Vanc, then would get ID consult as next step. 2. DM2 - 1. Hold metformin due to just having had IV contrast dye 2. Sensitive SSI AC 3. Continue weekly Dulaglutide 3. HTN - continue home BP meds  DVT prophylaxis: Lovenox Code Status: Full Family Communication: No family in room Disposition Plan: Home after improving Consults called: None Admission status: Admit to inpatient - IP status as she has failed appropriate outpatient ABx therapy.   Hillary Bow DO [facility] Pager [phone]  If 7AM-7PM, please contact day team taking care of patient [email or web address] Password  [email or web address]  07/03/2017, 1:21 AM

## 2017-07-03 NOTE — Progress Notes (Signed)
Pharmacy Antibiotic Note  [name] is a 52 y.o. female admitted on 07/02/2017 with cellulitis.  Pharmacy has been consulted for vancomycin dosing. Failed outpt therapy  Plan: Vancomycin 2gm load then 1gm IV q12 hours F/u renal function, cultures and clinical course  Height: 5\' 3"  (160 cm) Weight: 216 lb (98 kg) IBW/kg (Calculated) : 52.4  Temp (24hrs), Avg:98.3 F (36.8 C), Min:98.1 F (36.7 C), Max:98.4 F (36.9 C)  Recent Labs  Lab 07/02/17 1905  WBC 12.9*  CREATININE 0.95    Estimated Creatinine Clearance: 75.5 mL/min (by C-G formula based on SCr of 0.95 mg/dL).    Allergies  Allergen Reactions  . Compazine   . Lasix [Furosemide]     BREAKS OUT IN [facility]     Thank you for allowing pharmacy to be a part of this patient's care.  Talbert CageSeay, Eesha Schmaltz Poteet 07/03/2017 1:14 AM

## 2017-07-03 NOTE — ED Notes (Signed)
Pt. Reports burning and stinging around IV site. Noticeable swelling and redness to lower forearm. IV antibiotics discontinued. Julian ReilGardner and Pharmacist brought to room.

## 2017-07-03 NOTE — Progress Notes (Signed)
[name] is a 52 y.o. female with medical history significant for but not limited to DM2, presenting to the ED with worsening facial cellulitis with failed out-patient mx by PCP  CT Maxillofacial with IV Contrast notes Cellulitis otherwise negative with leukocytosis of 12.9k.  No clinical evidence of SIRS.  Given clindamycin by EDP, and admitted. She had a reaction to vancomycin and abx was switched to LZD. I  This morning her pain is better. Hemodynamically stable. Metformin held post IV Contrast with glucose 200 mg/dl - will add low dose lantus for now, and titrate up as tolerated with plans to resume metformin soon. D, Dr [name] Consulted to assist with abx management this morning

## 2017-07-03 NOTE — Consult Note (Signed)
[facility]  Total days of antibiotics 2               Reason for Consult: facial cellulitis    Referring Physician: [email or web address]  Principal Problem:   Cellulitis of face Active Problems:   DM2 (diabetes mellitus, type 2) (HCC)   HTN (hypertension)    HPI: [name] is a 52 y.o. female who has hx of dm and htn, who started to notice right sided facial swelling on Tuesday night that had quickly spread to erythema involving cheek, upper and lower eyelid. She went to her PCP the following day for evaluation, given IM ceftriaxone plus doxycycline, and followed up on the following day with progression of her symptoms with swelling, warm to touch, erythema, and weeping serous fluid. The fluid would crust at night where she unconsciously unroof her skin across her nose. She denies insect bite or trauma or any vesicles at the initiation of her facial cellulitis. She denies fever, or vision being obscured. She has frequently touched the affected area with her hands. She was given clindamycin without difficulty but when she was started on vancomycin last night, she noticed at the onset of infusion having tingling then erythema to dorsum of hand going up her forearm. Her nurse discontinued the infusion promptly and her symptoms have resolved since then. She was given a dose of linezolid in the interim. She has not had this happen to her before. In addn to right sided facial cellulitis to cheek and eyelids, also has a patch of macularpapular region behind her right ear. Forehead is spared. No allergies to antibiotics other than this reaction to vancomycin.she has shown me a picture of her rash when she had seen her PCP two days ago, where she had marked inflammation, swelling, weeping of the affected area to right side of face.labs reveal WBC of 12.9K. Facial CT per my review show subcutaneous stranding no abscess formation  Past Medical History:  Diagnosis Date  . Anxiety  and depression   . Diabetes mellitus   . GERD (gastroesophageal reflux disease)   . HTN (hypertension)   . Hyperlipidemia   . Hypothyroidism     Allergies:  Allergies  Allergen Reactions  . Compazine   . Lasix [Furosemide]     BREAKS OUT IN HIVES  . Vancomycin Rash    Patient developed localized itching, erythema, swelling.  Onset within 2 min of starting vanc IV.  Appears to be a true vanc allergy and not red-mans syndrome.    MEDICATIONS: . enoxaparin (LOVENOX) injection  40 mg Subcutaneous Q24H  . fenofibrate  160 mg Oral Daily  . fluticasone furoate-vilanterol  1 puff Inhalation Daily  . irbesartan  150 mg Oral Daily   And  . hydrochlorothiazide  25 mg Oral Daily  . insulin aspart  0-9 Units Subcutaneous TID WC  . insulin glargine  5 Units Subcutaneous Daily  . levothyroxine  200 mcg Oral QAC breakfast  . nicotine  21 mg Transdermal Daily  . sertraline  50 mg Oral Daily  . simvastatin  40 mg Oral QHS    Social History   Tobacco Use  . Smoking status: Current Every Day Smoker    Packs/day: 2.00    Types: Cigarettes  Substance Use Topics  . Alcohol use: Yes    Alcohol/week: 0.5 oz    Types: 1 Standard drinks or equivalent per week  . Drug use: No    Family History  Problem  Relation Age of Onset  . Heart attack Father   . Heart attack Paternal Uncle   . Heart attack Maternal Grandfather   . Heart attack Paternal Grandfather    Review of Systems  Constitutional: Negative for fever, chills, diaphoresis, activity change, appetite change, fatigue and unexpected weight change.  HENT: Negative for congestion, sore throat, rhinorrhea, sneezing, trouble swallowing and sinus pressure.  Eyes: Negative for photophobia and visual disturbance.  Respiratory: Negative for cough, chest tightness, shortness of breath, wheezing and stridor.  Cardiovascular: Negative for chest pain, palpitations and leg swelling.  Gastrointestinal: Negative for nausea, vomiting, abdominal  pain, diarrhea, constipation, blood in stool, abdominal distention and anal bleeding.  Genitourinary: Negative for dysuria, hematuria, flank pain and difficulty urinating.  Musculoskeletal: Negative for myalgias, back pain, joint swelling, arthralgias and gait problem.  Skin: +rash to right side of face, tingling/itchy Neurological: Negative for dizziness, tremors, weakness and light-headedness.  Hematological: Negative for adenopathy. Does not bruise/bleed easily.  Psychiatric/Behavioral: Negative for behavioral problems, confusion, sleep disturbance, dysphoric mood, decreased concentration and agitation.     OBJECTIVE: Temp:  [98 F (36.7 C)-98.4 F (36.9 C)] 98.1 F (36.7 C) (06/01 1405) Pulse Rate:  [68-98] 68 (06/01 1405) Resp:  [16-17] 17 (06/01 1405) BP: (93-128)/(59-91) 114/85 (06/01 1405) SpO2:  [90 %-97 %] 90 % (06/01 1405) Weight:  [216 lb (98 kg)] 216 lb (98 kg) (05/31 1906) Physical Exam  Constitutional:  oriented to person, place, and time. appears well-developed and well-nourished. No distress.  HENT: Reese/AT, PERRLA, no scleral icterus Mouth/Throat: Oropharynx is clear and moist. No oropharyngeal exudate.  Skin= abrasion across bridge of nose, erythema to right cheek including lower eyelid but no longer on upper eyelid nor forehead. Maculopapular rash behind right ear as well. Cardiovascular: Normal rate, regular rhythm and normal heart sounds. Exam reveals no gallop and no friction rub.  No murmur heard.  Pulmonary/Chest: Effort normal and breath sounds normal. No respiratory distress.  has no wheezes.  Neck = supple, no nuchal rigidity Abdominal: Soft. Bowel sounds are normal.  exhibits no distension. There is no tenderness.  Lymphadenopathy: no cervical adenopathy. No axillary adenopathy Neurological: alert and oriented to person, place, and time.  Psychiatric: a normal mood and affect.  behavior is normal.    LABS: Results for orders placed or performed during  the hospital encounter of 07/02/17 (from the past 48 hour(s))  Basic metabolic panel     Status: Abnormal   Collection Time: 07/02/17  7:05 PM  Result Value Ref Range   Sodium 140 135 - 145 mmol/L   Potassium 4.1 3.5 - 5.1 mmol/L   Chloride 103 101 - 111 mmol/L   CO2 25 22 - 32 mmol/L   Glucose, Bld 164 (H) 65 - 99 mg/dL   BUN 16 6 - 20 mg/dL   Creatinine, Ser 0.95 0.44 - 1.00 mg/dL   Calcium 10.0 8.9 - 10.3 mg/dL   GFR calc non Af Amer >60 >60 mL/min   GFR calc Af Amer >60 >60 mL/min    Comment: (NOTE) The eGFR has been calculated using the CKD EPI equation. This calculation has not been validated in all clinical situations. eGFR's persistently <60 mL/min signify possible Chronic Kidney Disease.    Anion gap 12 5 - 15    Comment: Performed at [facility] [address] [place], [place] [address]  CBC with Differential     Status: Abnormal   Collection Time: 07/02/17  7:05 PM  Result Value Ref  Range   WBC 12.9 (H) 4.0 - 10.5 K/uL   RBC 5.19 (H) 3.87 - 5.11 MIL/uL   Hemoglobin 16.2 (H) 12.0 - 15.0 g/dL   HCT 49.0 (H) 36.0 - 46.0 %   MCV 94.4 78.0 - 100.0 fL   MCH 31.2 26.0 - 34.0 pg   MCHC 33.1 30.0 - 36.0 g/dL   RDW 13.5 11.5 - 15.5 %   Platelets 348 150 - 400 K/uL   Neutrophils Relative % 58 %   Neutro Abs 7.5 1.7 - 7.7 K/uL   Lymphocytes Relative 28 %   Lymphs Abs 3.7 0.7 - 4.0 K/uL   Monocytes Relative 8 %   Monocytes Absolute 1.0 0.1 - 1.0 K/uL   Eosinophils Relative 4 %   Eosinophils Absolute 0.5 0.0 - 0.7 K/uL   Basophils Relative 1 %   Basophils Absolute 0.1 0.0 - 0.1 K/uL   Immature Granulocytes 1 %   Abs Immature Granulocytes 0.1 0.0 - 0.1 K/uL    Comment: Performed at [facility], [address] [place], [place] [address]  I-Stat beta hCG blood, ED     Status: Abnormal   Collection Time: 07/02/17  7:21 PM  Result Value Ref Range   I-stat hCG, quantitative 5.2 (H) <5 mIU/mL   Comment 3            Comment:   GEST. AGE      CONC.  (mIU/mL)    <=1 WEEK        5 - 50     2 WEEKS       50 - 500     3 WEEKS       100 - 10,000     4 WEEKS     1,000 - 30,000        FEMALE AND NON-PREGNANT FEMALE:     LESS THAN 5 mIU/mL   Basic metabolic panel     Status: Abnormal   Collection Time: 07/03/17  7:50 AM  Result Value Ref Range   Sodium 137 135 - 145 mmol/L   Potassium 4.1 3.5 - 5.1 mmol/L    Comment: SPECIMEN HEMOLYZED. HEMOLYSIS MAY AFFECT INTEGRITY OF RESULTS.   Chloride 101 101 - 111 mmol/L   CO2 24 22 - 32 mmol/L   Glucose, Bld 200 (H) 65 - 99 mg/dL   BUN 16 6 - 20 mg/dL   Creatinine, Ser 0.71 0.44 - 1.00 mg/dL   Calcium 9.2 8.9 - 10.3 mg/dL   GFR calc non Af Amer >60 >60 mL/min   GFR calc Af Amer >60 >60 mL/min    Comment: (NOTE) The eGFR has been calculated using the CKD EPI equation. This calculation has not been validated in all clinical situations. eGFR's persistently <60 mL/min signify possible Chronic Kidney Disease.    Anion gap 12 5 - 15    Comment: Performed at [facility] [address] [place], [place] [address]  Glucose, capillary     Status: Abnormal   Collection Time: 07/03/17 10:43 AM  Result Value Ref Range   Glucose-Capillary 208 (H) 65 - 99 mg/dL    MICRO: none IMAGING: Ct Maxillofacial W Contrast  Result Date: 07/03/2017 CLINICAL DATA:  Four days of swollen right face. EXAM: CT MAXILLOFACIAL WITH CONTRAST TECHNIQUE: Multidetector CT imaging of the maxillofacial structures was performed with intravenous contrast. Multiplanar CT image reconstructions were also generated. CONTRAST:  34m OMNIPAQUE IOHEXOL 300 MG/ML  SOLN COMPARISON:  10/17/2010 MRI of the  head. FINDINGS: Osseous: No fracture or mandibular dislocation. No destructive process. Orbits: Negative. No traumatic or inflammatory finding. Sinuses: Clear. Soft tissues: There is edema within the subcutaneous fat of the right face. No discrete rim enhancing abscess. Limited intracranial: No significant or unexpected finding. IMPRESSION:  Edema within subcutaneous fat of the right face which may be infectious or inflammatory. No discrete rim enhancing abscess or extension to the deep cervical compartments. Electronically Signed   By: Kristine Garbe M.D.   On: 07/03/2017 00:37    Assessment/Plan:  52yo F with DM admitted for right sided facial cellulitis not responding to oral antibiotics. I suspect that she did not have sufficient streptococcal coverage since she was placed on oral doxy but had some IM injection of ceftriaxone vs could be MRSA. The features of spreading quickly with brownish dried exudate similar to erisypelas  Will place her on clindamycin plus cefazolin to see if she responds quickly  If markedly improve can consider discharge on amoxicillin  Health maintenance = will check hiv and hep C ab  Vancomycin reaction = agree with pharmacy that this did not sound like red man syndrome but had infusion reaction. Unclear if any latex product involved since she is allergic to latex. Would not reintroduce at this time.

## 2017-07-04 ENCOUNTER — Encounter (HOSPITAL_COMMUNITY): Payer: Self-pay

## 2017-07-04 DIAGNOSIS — T7840XA Allergy, unspecified, initial encounter: Secondary | ICD-10-CM

## 2017-07-04 DIAGNOSIS — L282 Other prurigo: Secondary | ICD-10-CM

## 2017-07-04 DIAGNOSIS — L230 Allergic contact dermatitis due to metals: Secondary | ICD-10-CM

## 2017-07-04 LAB — BASIC METABOLIC PANEL
ANION GAP: 11 (ref 5–15)
BUN: 12 mg/dL (ref 6–20)
CHLORIDE: 100 mmol/L — AB (ref 101–111)
CO2: 24 mmol/L (ref 22–32)
Calcium: 9.1 mg/dL (ref 8.9–10.3)
Creatinine, Ser: 0.73 mg/dL (ref 0.44–1.00)
GFR calc non Af Amer: >60 mL/min (ref >60)
Glucose, Bld: 184 mg/dL — ABNORMAL HIGH (ref 65–99)
Potassium: 4.3 mmol/L (ref 3.5–5.1)
SODIUM: 135 mmol/L (ref 135–145)

## 2017-07-04 LAB — HIV ANTIBODY (ROUTINE TESTING W REFLEX): HIV Screen 4th Generation wRfx: NONREACTIVE

## 2017-07-04 LAB — GLUCOSE, CAPILLARY
GLUCOSE-CAPILLARY: 185 mg/dL — AB (ref 65–99)
GLUCOSE-CAPILLARY: 138 mg/dL — AB (ref 65–99)
GLUCOSE-CAPILLARY: 158 mg/dL — AB (ref 65–99)
Glucose-Capillary: 217 mg/dL — ABNORMAL HIGH (ref 65–99)

## 2017-07-04 MED ORDER — KETOROLAC TROMETHAMINE 30 MG/ML IJ SOLN
30.0000 mg | Freq: Once | INTRAMUSCULAR | Status: AC
  Administered 2017-07-04: 30 mg via INTRAVENOUS
  Filled 2017-07-04: qty 1

## 2017-07-04 MED ORDER — MOMETASONE FUROATE 0.1 % EX CREA
TOPICAL_CREAM | Freq: Every day | CUTANEOUS | Status: DC
  Administered 2017-07-04 – 2017-07-05 (×2): via TOPICAL
  Filled 2017-07-04: qty 15

## 2017-07-04 MED ORDER — HYDROXYZINE HCL 10 MG PO TABS
10.0000 mg | ORAL_TABLET | Freq: Three times a day (TID) | ORAL | Status: DC
  Administered 2017-07-04 – 2017-07-05 (×3): 10 mg via ORAL
  Filled 2017-07-04 (×3): qty 1

## 2017-07-04 MED ORDER — PNEUMOCOCCAL VAC POLYVALENT 25 MCG/0.5ML IJ INJ
0.5000 mL | INJECTION | INTRAMUSCULAR | Status: AC
  Administered 2017-07-05: 0.5 mL via INTRAMUSCULAR
  Filled 2017-07-04: qty 0.5

## 2017-07-04 MED ORDER — INSULIN GLARGINE 100 UNIT/ML ~~LOC~~ SOLN
10.0000 [IU] | Freq: Every day | SUBCUTANEOUS | Status: DC
  Administered 2017-07-05: 10 [IU] via SUBCUTANEOUS
  Filled 2017-07-04: qty 0.1

## 2017-07-04 NOTE — Progress Notes (Signed)
[facility]    Date of Admission:  07/02/2017   Total days of antibiotics 2           ID: [name] is a 52 y.o. female with facial cellulitis Principal Problem:   Cellulitis of face Active Problems:   DM2 (diabetes mellitus, type 2) (HCC)   HTN (hypertension)    Subjective: Afebrile, mild decrease in swelling in face, and less erythema. The patch behind her ear and about her neckline is inflammed. Appears to be itching her neck and ear   Medications:  . enoxaparin (LOVENOX) injection  40 mg Subcutaneous Q24H  . fenofibrate  160 mg Oral Daily  . fluticasone furoate-vilanterol  1 puff Inhalation Daily  . irbesartan  150 mg Oral Daily   And  . hydrochlorothiazide  25 mg Oral Daily  . hydrOXYzine  10 mg Oral TID  . insulin aspart  0-9 Units Subcutaneous TID WC  . [START ON 07/05/2017] insulin glargine  10 Units Subcutaneous Daily  . levothyroxine  200 mcg Oral QAC breakfast  . mometasone   Topical Daily  . nicotine  21 mg Transdermal Daily  . [START ON 07/05/2017] pneumococcal 23 valent vaccine  0.5 mL Intramuscular Tomorrow-1000  . sertraline  50 mg Oral Daily  . simvastatin  40 mg Oral QHS    Objective: Vital signs in last 24 hours: Temp:  [97.7 F (36.5 C)-98.7 F (37.1 C)] 97.7 F (36.5 C) (06/02 1225) Pulse Rate:  [67-81] 70 (06/02 1225) Resp:  [16] 16 (06/02 1225) BP: (113-130)/(62-84) 113/62 (06/02 1225) SpO2:  [92 %-95 %] 92 % (06/02 1225) gen = a xo by 3 in nad Skin = less swelling to lower eyelid. Less demarcation to her cheek. Area has crusted over. Has raised maculopapular rash behind ear and some small patch by her neck Chest = scattered maculopapules under right breast  Lab Results Recent Labs    07/02/17 1905 07/03/17 0750 07/04/17 0407  WBC 12.9*  --   --   HGB 16.2*  --   --   HCT 49.0*  --   --   NA 140 137 135  K 4.1 4.1 4.3  CL 103 101 100*  CO2 25 24 24   BUN 16 16 12   CREATININE 0.95 0.71 0.73     Studies/Results: Ct Maxillofacial W Contrast  Result Date: 07/03/2017 CLINICAL DATA:  Four days of swollen right face. EXAM: CT MAXILLOFACIAL WITH CONTRAST TECHNIQUE: Multidetector CT imaging of the maxillofacial structures was performed with intravenous contrast. Multiplanar CT image reconstructions were also generated. CONTRAST:  75mL OMNIPAQUE IOHEXOL 300 MG/ML  SOLN COMPARISON:  10/17/2010 MRI of the head. FINDINGS: Osseous: No fracture or mandibular dislocation. No destructive process. Orbits: Negative. No traumatic or inflammatory finding. Sinuses: Clear. Soft tissues: There is edema within the subcutaneous fat of the right face. No discrete rim enhancing abscess. Limited intracranial: No significant or unexpected finding. IMPRESSION: Edema within subcutaneous fat of the right face which may be infectious or inflammatory. No discrete rim enhancing abscess or extension to the deep cervical compartments. Electronically Signed   By: Mitzi Hansen M.D.   On: 07/03/2017 00:37     Assessment/Plan: Facial cellulitis = continue on IV cefazolin plus IV clindamycin today and can stop when she is ready for discharge. If she continues to improve, can likely d/c tomorrow. Would discharged her on 5 addn days of amox 500mg  tid plus clindamycin 450mg  TID  Contact dermatitis = I  think she has 2 different processes since the area behind her ear looks unchanged if not worse. Patient itches the area during exam. Also an affected area is quite linear next to her necklace. I wonder if she has element of contact dermatitis. Will prescribe daily mometasone 0.1 % cream daily. Recommend to use x 7d and to use under her right breast  She probably has element of auto-inoculation since she is using bare hands with long finger nails to itch various areas of her face and body  Pruritis = I have scheduled atarax 10mg  TID to see if that helps as well.  Will sign off.  [facility] Hospital[facility]ynthia Natsumi [facility] Cell: [phone] Pager: [phone]  07/04/2017, 2:30 PM

## 2017-07-04 NOTE — Progress Notes (Signed)
[facility]                                                                              Patient Demographics  [name], is a 52 y.o. female, DOB - Dec 13, 1965, BMW:[id number]  Admit date - 07/02/2017   Admitting Physician Hillary Bow, DO  Outpatient Primary MD for the patient is Patient, No Pcp Per  Outpatient specialists:   LOS - 1  days    No chief complaint on file.      Brief summary  [] Hover for details [name] a 52 y.o.femalewith medical history significant for but not limited toDM2, presenting to the ED with worsening facial cellulitis with failed out-patient mx by PCP  CT Maxillofacial with IV Contrast notes Cellulitis otherwise negative with leukocytosis of 12.9k. No clinical evidence of SIRS. Given clindamycin by EDP, and admitted. She had a reaction to vancomycin and abx was switched to LZD. I  This morning her pain is better. Hemodynamically stable. Metformin held post IV Contrast with glucose 200 mg/dl - will add low dose lantus for now, and titrate up as tolerated with plans to resume metformin soon. D, Dr Judyann Munson Consulted to assist with abx management this morning      Assessment & Plan   Will place her on to see if she responds quickly  If markedly improve can consider discharge on amoxicillin  Health maintenance = will check hiv and hep C ab    Principal Problem:   Cellulitis of face Active Problems:   DM2 (diabetes mellitus, type 2) (HCC)   HTN (hypertension)      1. Cellulitis of face: 1. Failed outpatient PO doxycycline AND 2 injections of IM Rocephin 2. Reaction to Vanco 3. ID consulted- rec clindamycin andcefazolin; d/c on amox if remarkable improvement 4. Check HIV and Hep C   2. DM2: 1.  Metformin held post IV contrast dye 2. Cont with Insulin 3. Continue weekly Dulaglutide   3. HTN:              1.continue home BP meds  DVT prophylaxis: Lovenox Code Status:  Full Family Communication: N/a Disposition Plan: Home     Time Spent in minutes   35 minutes  Procedures:    Consultants:   ID  Antimicrobials:      Medications  Scheduled Meds: . enoxaparin (LOVENOX) injection  40 mg Subcutaneous Q24H  . fenofibrate  160 mg Oral Daily  . fluticasone furoate-vilanterol  1 puff Inhalation Daily  . irbesartan  150 mg Oral Daily   And  . hydrochlorothiazide  25 mg Oral Daily  . insulin aspart  0-9 Units Subcutaneous TID WC  . insulin glargine  5 Units Subcutaneous Daily  . levothyroxine  200 mcg Oral QAC breakfast  . nicotine  21 mg Transdermal Daily  . [START ON 07/05/2017] pneumococcal 23 valent vaccine  0.5 mL Intramuscular Tomorrow-1000  . sertraline  50 mg Oral Daily  . simvastatin  40 mg Oral QHS   Continuous Infusions: .  ceFAZolin (ANCEF) IV Stopped (07/04/17 0636)  . clindamycin (CLEOCIN) IV Stopped (07/04/17 0549)   PRN Meds:.acetaminophen **OR**  acetaminophen, ondansetron **OR** ondansetron (ZOFRAN) IV   Antibiotics   Anti-infectives (From admission, onward)   Start     Dose/Rate Route Frequency Ordered Stop   07/03/17 1400  vancomycin (VANCOCIN) IVPB 1000 mg/200 mL premix  Status:  Discontinued     1,000 mg 200 mL/hr over 60 Minutes Intravenous Every 12 hours 07/03/17 0119 07/03/17 0154   07/03/17 1400  ceFAZolin (ANCEF) IVPB 1 g/50 mL premix     1 g 100 mL/hr over 30 Minutes Intravenous Every 8 hours 07/03/17 1251     07/03/17 1400  clindamycin (CLEOCIN) IVPB 600 mg     600 mg 100 mL/hr over 30 Minutes Intravenous Every 8 hours 07/03/17 1251     07/03/17 0700  clindamycin (CLEOCIN) IVPB 600 mg  Status:  Discontinued     600 mg 100 mL/hr over 30 Minutes Intravenous Every 8 hours 07/03/17 0056 07/03/17 0058   07/03/17 0300  linezolid (ZYVOX) tablet 600 mg     600 mg Oral  Once 07/03/17 0154 07/03/17 0606   07/03/17 0115  vancomycin (VANCOCIN) 2,000 mg in sodium chloride 0.9 % 500 mL IVPB     2,000 mg 250 mL/hr over  120 Minutes Intravenous  Once 07/03/17 0111 07/03/17 0125   07/02/17 2315  clindamycin (CLEOCIN) IVPB 600 mg     600 mg 100 mL/hr over 30 Minutes Intravenous  Once 07/02/17 2303 07/02/17 2357        Subjective:   [name] was seen and examined today.   Patient denies dizziness, chest pain, shortness of breath, no fever or chills  Objective:   Vitals:   07/03/17 0926 07/03/17 1405 07/03/17 2119 07/04/17 0609  BP:  114/85 116/72 130/84  Pulse:  68 81 67  Resp:  17    Temp:  98.1 F (36.7 C) 98.7 F (37.1 C) 97.8 F (36.6 C)  TempSrc:  Oral Oral Oral  SpO2: 93% 90% 95% 93%  Weight:      Height:        Intake/Output Summary (Last 24 hours) at 07/04/2017 1025 Last data filed at 07/04/2017 0900 Gross per 24 hour  Intake 540 ml  Output -  Net 540 ml     Wt Readings from Last 3 Encounters:  07/02/17 98 kg (216 lb)  08/12/10 117 kg (258 lb)  07/10/10 120.7 kg (266 lb)     Exam  General: NAD  HEENT: NCAT,  PERRL,MMM  Neck: SUPPLE, (-) JVD  Cardiovascular: RRR, (-) GALLOP, (-) MURMUR  Respiratory: CTA  Gastrointestinal: SOFT, (-) DISTENSION, BS(+), (_) TENDERNESS  Ext: (-) CYANOSIS, (-) EDEMA  Neuro: A, OX 3  Skin:(+) right facial swelling/redness, warm to touch and tender to palpation  Psych:NORMAL AFFECT/MOOD   Data Reviewed:  I have personally reviewed following labs and imaging studies  Micro Results No results found for this or any previous visit (from the past 240 hour(s)).  Radiology Reports Ct Maxillofacial W Contrast  Result Date: 07/03/2017 CLINICAL DATA:  Four days of swollen right face. EXAM: CT MAXILLOFACIAL WITH CONTRAST TECHNIQUE: Multidetector CT imaging of the maxillofacial structures was performed with intravenous contrast. Multiplanar CT image reconstructions were also generated. CONTRAST:  75mL OMNIPAQUE IOHEXOL 300 MG/ML  SOLN COMPARISON:  10/17/2010 MRI of the head. FINDINGS: Osseous: No fracture or mandibular dislocation. No  destructive process. Orbits: Negative. No traumatic or inflammatory finding. Sinuses: Clear. Soft tissues: There is edema within the subcutaneous fat of the right face. No discrete rim enhancing abscess. Limited intracranial: No  significant or unexpected finding. IMPRESSION: Edema within subcutaneous fat of the right face which may be infectious or inflammatory. No discrete rim enhancing abscess or extension to the deep cervical compartments. Electronically Signed   By: Mitzi Hansen M.D.   On: 07/03/2017 00:37    Lab Data:  CBC: Recent Labs  Lab 07/02/17 1905  WBC 12.9*  NEUTROABS 7.5  HGB 16.2*  HCT 49.0*  MCV 94.4  PLT 348   Basic Metabolic Panel: Recent Labs  Lab 07/02/17 1905 07/03/17 0750 07/04/17 0407  NA 140 137 135  K 4.1 4.1 4.3  CL 103 101 100*  CO2 25 24 24   GLUCOSE 164* 200* 184*  BUN 16 16 12   CREATININE 0.95 0.71 0.73  CALCIUM 10.0 9.2 9.1   GFR: Estimated Creatinine Clearance: 89.6 mL/min (by C-G formula based on SCr of 0.73 mg/dL). Liver Function Tests: No results for input(s): AST, ALT, ALKPHOS, BILITOT, PROT, ALBUMIN in the last 168 hours. No results for input(s): LIPASE, AMYLASE in the last 168 hours. No results for input(s): AMMONIA in the last 168 hours. Coagulation Profile: No results for input(s): INR, PROTIME in the last 168 hours. Cardiac Enzymes: No results for input(s): CKTOTAL, CKMB, CKMBINDEX, TROPONINI in the last 168 hours. BNP (last 3 results) No results for input(s): PROBNP in the last 8760 hours. HbA1C: No results for input(s): HGBA1C in the last 72 hours. CBG: Recent Labs  Lab 07/03/17 1043 07/03/17 1626 07/03/17 2120 07/04/17 0610  GLUCAP 208* 166* 234* 185*   Lipid Profile: No results for input(s): CHOL, HDL, LDLCALC, TRIG, CHOLHDL, LDLDIRECT in the last 72 hours. Thyroid Function Tests: No results for input(s): TSH, T4TOTAL, FREET4, T3FREE, THYROIDAB in the last 72 hours. Anemia Panel: No results for  input(s): VITAMINB12, FOLATE, FERRITIN, TIBC, IRON, RETICCTPCT in the last 72 hours. Urine analysis: No results found for: COLORURINE, APPEARANCEUR, LABSPEC, PHURINE, GLUCOSEU, HGBUR, BILIRUBINUR, KETONESUR, PROTEINUR, UROBILINOGEN, NITRITE, Murrell Converse M.D. [facility] 07/04/2017, 10:25 AM  Pager: [phone] Between 7am to 7pm - call Pager - [phone]  After 7pm go to [email or web address] - password [email or web address]  Call night coverage person covering after 7pm

## 2017-07-05 LAB — CBC
HEMATOCRIT: 44.1 % (ref 36.0–46.0)
Hemoglobin: 14.7 g/dL (ref 12.0–15.0)
MCH: 31.4 pg (ref 26.0–34.0)
MCHC: 33.3 g/dL (ref 30.0–36.0)
MCV: 94.2 fL (ref 78.0–100.0)
Platelets: 224 10*3/uL (ref 150–400)
RBC: 4.68 MIL/uL (ref 3.87–5.11)
RDW: 13.2 % (ref 11.5–15.5)
WBC: 7.8 10*3/uL (ref 4.0–10.5)

## 2017-07-05 LAB — BASIC METABOLIC PANEL
Anion gap: 9 (ref 5–15)
BUN: 13 mg/dL (ref 6–20)
CO2: 29 mmol/L (ref 22–32)
Calcium: 9.2 mg/dL (ref 8.9–10.3)
Chloride: 103 mmol/L (ref 101–111)
Creatinine, Ser: 0.75 mg/dL (ref 0.44–1.00)
GFR calc Af Amer: >60 mL/min (ref >60)
GFR calc non Af Amer: >60 mL/min (ref >60)
GLUCOSE: 204 mg/dL — AB (ref 65–99)
POTASSIUM: 3.9 mmol/L (ref 3.5–5.1)
Sodium: 141 mmol/L (ref 135–145)

## 2017-07-05 LAB — GLUCOSE, CAPILLARY: Glucose-Capillary: 192 mg/dL — ABNORMAL HIGH (ref 65–99)

## 2017-07-05 MED ORDER — AMOXICILLIN 500 MG PO CAPS: 500.0000 mg | ORAL_CAPSULE | Freq: Three times a day (TID) | ORAL | 0 refills | Status: AC

## 2017-07-05 MED ORDER — CLINDAMYCIN HCL 150 MG PO CAPS: 450.0000 mg | ORAL_CAPSULE | Freq: Four times a day (QID) | ORAL | 0 refills | Status: AC

## 2017-07-05 MED ORDER — MOMETASONE FUROATE 0.1 % EX CREA: TOPICAL_CREAM | Freq: Every day | CUTANEOUS | 0 refills | Status: AC

## 2017-07-05 MED ORDER — HYDROXYZINE HCL 10 MG PO TABS: 10.0000 mg | ORAL_TABLET | Freq: Three times a day (TID) | ORAL | 0 refills | Status: AC | PRN

## 2017-07-05 NOTE — Discharge Summary (Signed)
Physician Discharge Summary  JAZ LANINGHAM ZOX:[id number] DOB: 1965/02/24 DOA: 07/02/2017  PCP: Patient, No Pcp Per  Admit date: 07/02/2017 Discharge date: 07/05/2017  Admitted From: Home Disposition:  Home  Recommendations for Outpatient Follow-up:  1. Follow up with PCP in 1-2 weeks 2. Please obtain BMP/CBC in one week your next doctors visit.  3. Take amoxicillin 500 mg 3 times daily, clindamycin 400 mg 3 times daily both for 7 days. 4. Mometasone 0.1% daily for 7 days.  If no improvement, follow-up with outpatient dermatology 5. Atarax 10 mg 3 times daily  Home Health: None Equipment/Devices: None Discharge Condition: Stable CODE STATUS: Full code Diet recommendation: Diabetic  Brief/Interim Summary: 52 year old with history of diabetes mellitus type 2, essential hypertension, contact dermatitis came to the hospital with complains of worsening of facial cellulitis.  Apparently patient was on outpatient antibiotics which did not help much therefore came to the ER for evaluation.  CT of the maxillofacial with IV contrast suggested cellulitis.  Initially started on IV ceftezole and and IV clindamycin which was later switched to oral amoxicillin and clindamycin at the recommendations of infectious disease.  There was also concern of contact dermatitis therefore started on mometasone 0.1% cream daily for 7 days and then follow-up with outpatient dermatology.  Due to mild pruritus patient was started on Atarax 10 mg 3 times daily. Over the course of couple of days patient's symptoms improved quite a bit and was stable to be discharged.  She has reached maximal benefit from an hospital stay and stable to be discharged today.   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  Did well here with IV cefazolin and clindamycin.  Appreciate infectious disease input.  Will discharge patient on Augmentin and clindamycin as mentioned above  Diabetes mellitus type 2 -Resume home medications  Essential hypertension -Continue home meds  Discharge Instructions   Allergies as of 07/05/2017      Reactions   Compazine    Lasix [furosemide]    BREAKS OUT IN HIVES   Vancomycin Rash   Patient developed localized itching, erythema, swelling.  Onset within 2 min of starting vanc IV.  Appears to be a true vanc allergy and not red-mans syndrome.      Medication List    TAKE these medications   amoxicillin 500 MG capsule Commonly known as:  AMOXIL Take 1 capsule (500 mg total) by mouth 3 (three) times daily for 7 days.   clindamycin 150 MG capsule Commonly known as:  CLEOCIN Take 3 capsules (450 mg total) by mouth 4 (four) times daily for 7 days.   fenofibrate 160 MG tablet Take 160 mg by mouth daily.   hydrOXYzine 10 MG tablet Commonly known as:  ATARAX/VISTARIL Take 1 tablet (10 mg total) by mouth 3 (three) times daily as needed for itching.   levothyroxine 200 MCG tablet Commonly known as:  SYNTHROID, LEVOTHROID Take 200 mcg by mouth daily.   metFORMIN 500 MG tablet Commonly known as:  GLUCOPHAGE Take 1,000 mg by mouth 2 (two) times daily with a meal.   mometasone 0.1 % cream Commonly known as:  ELOCON Apply topically daily for 7 days. Start taking on:  07/06/2017   sertraline 50 MG tablet Commonly known as:  ZOLOFT  Take 50 mg by mouth daily.   simvastatin 40 MG tablet Commonly known as:  ZOCOR Take 40 mg by mouth at bedtime.   SYMBICORT 160-4.5 MCG/ACT inhaler Generic drug:  budesonide-formoterol Inhale 2 puffs into the lungs 2 (two) times daily.   TRULICITY 1.5 MG/0.5ML Sopn Generic drug:  Dulaglutide Inject 1.5 mg into the skin once a week. On Sunday   valsartan-hydrochlorothiazide 160-25 MG  tablet Commonly known as:  DIOVAN-HCT Take 1 tablet by mouth daily.      Follow-up Information    [facility]. Schedule an appointment as soon as possible for a visit in 1 week(s).   Contact information: [address] [place] [id number] [phone]         Allergies  Allergen Reactions  . Compazine   . Lasix [Furosemide]     BREAKS OUT IN HIVES  . Vancomycin Rash    Patient developed localized itching, erythema, swelling.  Onset within 2 min of starting vanc IV.  Appears to be a true vanc allergy and not red-mans syndrome.    You were cared for by a hospitalist during your hospital stay. If you have any questions about your discharge medications or the care you received while you were in the hospital after you are discharged, you can call the unit and asked to speak with the hospitalist on call if the hospitalist that took care of you is not available. Once you are discharged, your primary care physician will handle any further medical issues. Please note that no refills for any discharge medications will be authorized once you are discharged, as it is imperative that you return to your primary care physician (or establish a relationship with a primary care physician if you do not have one) for your aftercare needs so that they can reassess your need for medications and monitor your lab values.  Consultations:  Infectious Disease   Procedures/Studies: Ct Maxillofacial W Contrast  Result Date: 07/03/2017 CLINICAL DATA:  Four days of swollen right face. EXAM: CT MAXILLOFACIAL WITH CONTRAST TECHNIQUE: Multidetector CT imaging of the maxillofacial structures was performed with intravenous contrast. Multiplanar CT image reconstructions were also generated. CONTRAST:  75mL OMNIPAQUE IOHEXOL 300 MG/ML  SOLN COMPARISON:  10/17/2010 MRI of the head. FINDINGS: Osseous: No fracture or mandibular dislocation. No destructive process. Orbits:  Negative. No traumatic or inflammatory finding. Sinuses: Clear. Soft tissues: There is edema within the subcutaneous fat of the right face. No discrete rim enhancing abscess. Limited intracranial: No significant or unexpected finding. IMPRESSION: Edema within subcutaneous fat of the right face which may be infectious or inflammatory. No discrete rim enhancing abscess or extension to the deep cervical compartments. Electronically Signed   By: Mitzi HansenLance  Furusawa-Stratton M.D.   On: 07/03/2017 00:37      Subjective: No complaints.  Eager to go home, states that right-sided facial erythema and swelling is greatly improved.  Discharge Exam: Vitals:   07/05/17 0458 07/05/17 0859  BP: 119/84   Pulse: 65 79  Resp:  16  Temp: 97.9 F (36.6 C)   SpO2: 90% 91%   Vitals:   07/04/17 1225 07/04/17 2017 07/05/17 0458 07/05/17 0859  BP: 113/62 125/79 119/84   Pulse: 70 74 65 79  Resp: 16   16  Temp: 97.7 F (36.5 C) (!) 97.4 F (36.3 C) 97.9 F (36.6 C)   TempSrc: Oral Oral Oral   SpO2: 92% 90% 90% 91%  Weight:      Height:  General: Pt is alert, awake, not in acute distress, mild erythema of the right facial area but greatly improved Cardiovascular: RRR, S1/S2 +, no rubs, no gallops Respiratory: CTA bilaterally, no wheezing, no rhonchi Abdominal: Soft, NT, ND, bowel sounds + Extremities: no edema, no cyanosis    The results of significant diagnostics from this hospitalization (including imaging, microbiology, ancillary and laboratory) are listed below for reference.     Microbiology: No results found for this or any previous visit (from the past 240 hour(s)).   Labs: BNP (last 3 results) No results for input(s): BNP in the last 8760 hours. Basic Metabolic Panel: Recent Labs  Lab 07/02/17 1905 07/03/17 0750 07/04/17 0407 07/05/17 0524  NA 140 137 135 141  K 4.1 4.1 4.3 3.9  CL 103 101 100* 103  CO2 25 24 24 29   GLUCOSE 164* 200* 184* 204*  BUN 16 16 12 13   CREATININE  0.95 0.71 0.73 0.75  CALCIUM 10.0 9.2 9.1 9.2   Liver Function Tests: No results for input(s): AST, ALT, ALKPHOS, BILITOT, PROT, ALBUMIN in the last 168 hours. No results for input(s): LIPASE, AMYLASE in the last 168 hours. No results for input(s): AMMONIA in the last 168 hours. CBC: Recent Labs  Lab 07/02/17 1905 07/05/17 0524  WBC 12.9* 7.8  NEUTROABS 7.5  --   HGB 16.2* 14.7  HCT 49.0* 44.1  MCV 94.4 94.2  PLT 348 224   Cardiac Enzymes: No results for input(s): CKTOTAL, CKMB, CKMBINDEX, TROPONINI in the last 168 hours. BNP: Invalid input(s): POCBNP CBG: Recent Labs  Lab 07/04/17 0610 07/04/17 1132 07/04/17 1617 07/04/17 2137 07/05/17 0635  GLUCAP 185* 138* 158* 217* 192*   D-Dimer No results for input(s): DDIMER in the last 72 hours. Hgb A1c No results for input(s): HGBA1C in the last 72 hours. Lipid Profile No results for input(s): CHOL, HDL, LDLCALC, TRIG, CHOLHDL, LDLDIRECT in the last 72 hours. Thyroid function studies No results for input(s): TSH, T4TOTAL, T3FREE, THYROIDAB in the last 72 hours.  Invalid input(s): FREET3 Anemia work up No results for input(s): VITAMINB12, FOLATE, FERRITIN, TIBC, IRON, RETICCTPCT in the last 72 hours. Urinalysis No results found for: COLORURINE, APPEARANCEUR, LABSPEC, PHURINE, GLUCOSEU, HGBUR, BILIRUBINUR, KETONESUR, PROTEINUR, UROBILINOGEN, NITRITE, LEUKOCYTESUR Sepsis Labs Invalid input(s): PROCALCITONIN,  WBC,  LACTICIDVEN Microbiology No results found for this or any previous visit (from the past 240 hour(s)).   Time coordinating discharge:  I have spent 35 minutes face to face with the patient and on the ward discussing the patients care, assessment, plan and disposition with other care givers. >50% of the time was devoted counseling the patient about the risks and benefits of treatment/Discharge disposition and coordinating care.   SIGNED:   Dimple Nanas, MD  [facility] 07/05/2017, 12:11 PM Pager    If 7PM-7AM, please contact night-coverage [email or web address] Password [email or web address]

## 2019-02-05 ENCOUNTER — Other Ambulatory Visit: Payer: Self-pay

## 2019-02-05 ENCOUNTER — Encounter: Payer: Self-pay | Admitting: Emergency Medicine

## 2019-02-05 ENCOUNTER — Ambulatory Visit
Admission: EM | Admit: 2019-02-05 | Discharge: 2019-02-05 | Disposition: A | Discharge status: Home / Self Care | Payer: BC Managed Care – PPO | Attending: Emergency Medicine | Admitting: Emergency Medicine

## 2019-02-05 DIAGNOSIS — I160 Hypertensive urgency: Secondary | ICD-10-CM | POA: Diagnosis not present

## 2019-02-05 DIAGNOSIS — Z20828 Contact with and (suspected) exposure to other viral communicable diseases: Secondary | ICD-10-CM | POA: Diagnosis not present

## 2019-02-05 DIAGNOSIS — R600 Localized edema: Secondary | ICD-10-CM | POA: Diagnosis not present

## 2019-02-05 DIAGNOSIS — R0981 Nasal congestion: Secondary | ICD-10-CM | POA: Diagnosis not present

## 2019-02-05 MED ORDER — VALSARTAN-HYDROCHLOROTHIAZIDE 160-25 MG PO TABS: 1.0000 | ORAL_TABLET | Freq: Every day | ORAL | 0 refills | Status: DC

## 2019-02-05 MED ORDER — SYMBICORT 160-4.5 MCG/ACT IN AERO: 2.0000 | INHALATION_SPRAY | Freq: Two times a day (BID) | RESPIRATORY_TRACT | 0 refills | Status: DC

## 2019-02-05 MED ORDER — METFORMIN HCL 500 MG PO TABS: 1000.0000 mg | ORAL_TABLET | Freq: Two times a day (BID) | ORAL | 0 refills | Status: DC

## 2019-02-05 NOTE — Discharge Instructions (Addendum)
Take 1 tab metformin 2 times a day x 1 week, then back to 2 tabs 2 times a day. Need to follow up with PCP for additional refills and further BP management. Please go to ER for persistent leg swelling, chest pain, difficulty breathing.

## 2019-02-05 NOTE — ED Provider Notes (Addendum)
[name]    CSN: [id number] Arrival date & time: 02/05/19  1206      History   Chief Complaint Chief Complaint  Patient presents with  . URI    HPI [name] is a 54 y.o. female with h/o COPD, DM, HTN  Presenting for Covid testing: Exposure: sister who tested positive this week Date of exposure: 12/24 Any fever, symptoms since exposure: yes: 3 day course of increased cough, SOB with fatigue, headaches, nasal congestion.  Has tried APAP w/ relief of headache.   Of note, patient has been out of her chronic medications for the last month secondary cause.  States she recently had her health benefits reactivated and cannot afford medications at this time.  Past Medical History:  Diagnosis Date  . Anxiety and depression   . Diabetes mellitus   . GERD (gastroesophageal reflux disease)   . HTN (hypertension)   . Hyperlipidemia   . Hypothyroidism     Patient Active Problem List   Diagnosis Date Noted  . 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 Pruritic rash   . Allergic drug reaction   . Cellulitis of face 07/03/2017  . DM2 (diabetes mellitus, type 2) ([place]) 07/03/2017  . HTN (hypertension) 07/03/2017  . Dyspnea 07/10/2010  . Tobacco abuse 07/10/2010    Past Surgical History:  Procedure Laterality Date  . Ankle surgery x 2    . Exploratory Laparoscopy    . PARTIAL HYSTERECTOMY      OB History   No obstetric history on file.      Home Medications    Prior to Admission medications   Medication Sig Start Date End Date Taking? Authorizing Provider  fenofibrate 160 MG tablet Take 160 mg by mouth daily. 06/28/17   [provider]  levothyroxine (SYNTHROID, LEVOTHROID) 200 MCG tablet Take 200 mcg by mouth daily.      [provider]  metFORMIN (GLUCOPHAGE) 500 MG tablet Take 2 tablets (1,000 mg total) by mouth 2 (two) times daily with a meal. 02/05/19 03/07/19  Hall-Potvin, [place], PA-C  sertraline (ZOLOFT) 50 MG tablet Take 50 mg by  mouth daily. 02/26/17   [provider]  simvastatin (ZOCOR) 40 MG tablet Take 40 mg by mouth at bedtime.      [provider]  SYMBICORT 160-4.5 MCG/ACT inhaler Inhale 2 puffs into the lungs 2 (two) times daily. 02/05/19   Hall-Potvin, [place], PA-C  valsartan-hydrochlorothiazide (DIOVAN-HCT) 160-25 MG tablet Take 1 tablet by mouth daily. 02/05/19   Hall-Potvin, [place], PA-C    Family History Family History  Problem Relation Age of Onset  . Heart attack Father   . Heart attack Paternal Uncle   . Heart attack Maternal Grandfather   . Heart attack Paternal Grandfather     Social History Social History   Tobacco Use  . Smoking status: Current Every Day Smoker    Packs/day: 1.00    Types: Cigarettes  . Smokeless tobacco: Never Used  Substance Use Topics  . Alcohol use: Yes    Alcohol/week: 1.0 standard drinks    Types: 1 Standard drinks or equivalent per week  . Drug use: No     Allergies   Compazine, Lasix [furosemide], and Vancomycin   Review of Systems Review of Systems  Constitutional: Positive for fatigue. Negative for fever.  HENT: Positive for congestion, ear pain, postnasal drip and sore throat. Negative for dental problem, ear discharge, facial swelling, hearing loss, sinus pain, trouble swallowing and voice change.  Eyes: Negative for photophobia, pain and visual disturbance.  Respiratory: Positive for cough. Negative for shortness of breath and wheezing.   Cardiovascular: Negative for chest pain, palpitations and leg swelling.  Gastrointestinal: Negative for abdominal pain, diarrhea, nausea and vomiting.  Musculoskeletal: Negative for arthralgias and myalgias.  Neurological: Negative for dizziness and headaches.     Physical Exam Triage Vital Signs ED Triage Vitals  Enc Vitals Group     BP      Pulse      Resp      Temp      Temp src      SpO2      Weight      Height      Head Circumference      Peak Flow      Pain Score       Pain Loc      Pain Edu?      Excl. in GC?    No data found.  Updated Vital Signs BP (!) 177/134 (BP Location: Left Arm)   Pulse 77   Temp 98 F (36.7 C) (Temporal)   Resp 20   SpO2 95%   Visual Acuity Right Eye Distance:   Left Eye Distance:   Bilateral Distance:    Right Eye Near:   Left Eye Near:    Bilateral Near:     Physical Exam Constitutional:      General: She is not in acute distress.    Appearance: She is obese. She is not ill-appearing or diaphoretic.  HENT:     Head: Normocephalic and atraumatic.     Mouth/Throat:     Mouth: Mucous membranes are moist.     Pharynx: Oropharynx is clear.  Eyes:     General: No scleral icterus.    Pupils: Pupils are equal, round, and reactive to light.  Neck:     Comments: Trachea midline, negative JVD Cardiovascular:     Rate and Rhythm: Normal rate and regular rhythm.     Pulses: Normal pulses.     Heart sounds: No murmur. No friction rub. No gallop.   Pulmonary:     Effort: Pulmonary effort is normal. No respiratory distress.     Breath sounds: No stridor. No wheezing, rhonchi or rales.  Musculoskeletal:        General: Tenderness present. No deformity. Normal range of motion.     Cervical back: Neck supple. No tenderness.     Right lower leg: Edema present.     Left lower leg: Edema present.  Lymphadenopathy:     Cervical: No cervical adenopathy.  Skin:    General: Skin is warm.     Capillary Refill: Capillary refill takes less than 2 seconds.     Coloration: Skin is not jaundiced or pale.     Findings: No bruising, erythema or rash.  Neurological:     General: No focal deficit present.     Mental Status: She is alert and oriented to person, place, and time.      UC Treatments / Results  Labs (all labs ordered are listed, but only abnormal results are displayed) Labs Reviewed  NOVEL CORONAVIRUS, NAA    EKG   Radiology No results found.  Procedures Procedures (including critical care  time)  Medications Ordered in UC Medications - No data to display  Initial Impression / Assessment and Plan / UC Course  I have reviewed the triage vital signs and the nursing notes.  Pertinent labs & imaging  results that were available during my care of the patient were reviewed by me and considered in my medical decision making (see chart for details).     Patient afebrile, nontoxic, with SpO2 95%.  Covid PCR pending.  Patient to quarantine until results are back.    While at bedside, patient appears to be reluctant to endorse symptoms.  This provider notes gross lower extremity edema without weeping which patient had denied earlier.  Patient adamant she is not in any acute distress, denying chest pain.  Declines EKG, chest x-ray in office stating that she will get better when she takes her medications again.  This provider voiced concern for acute worsening of chronic conditions and possible need for further evaluation as patient has hypertensive urgency with lower extremity edema, nocturnal and exertional dyspnea.  Recommended patient go to the ER: Declined.  This provider willing to refill chronic medications which have been previously well-tolerated.  Encouraged compression sock use, leg elevation.  ER return precautions discussed, patient verbalized understanding and is agreeable to plan. Final Clinical Impressions(s) / UC Diagnoses   Final diagnoses:  Nasal congestion  Hypertensive urgency  Bilateral lower extremity edema     Discharge Instructions     Take 1 tab metformin 2 times a day x 1 week, then back to 2 tabs 2 times a day. Need to follow up with PCP for additional refills and further BP management. Please go to ER for persistent leg swelling, chest pain, difficulty breathing.    ED Prescriptions    Medication Sig Dispense Auth. Provider   valsartan-hydrochlorothiazide (DIOVAN-HCT) 160-25 MG tablet Take 1 tablet by mouth daily. 30 tablet Hall-Potvin, [place], PA-C    SYMBICORT 160-4.5 MCG/ACT inhaler Inhale 2 puffs into the lungs 2 (two) times daily. 1 Inhaler Hall-Potvin, [place], PA-C   metFORMIN (GLUCOPHAGE) 500 MG tablet Take 2 tablets (1,000 mg total) by mouth 2 (two) times daily with a meal. 120 tablet Hall-Potvin, [place], PA-C     PDMP not reviewed this encounter.   Hall-Potvin, [place], PA-C 02/05/19 1930    [facility] [place], [place] 02/05/19 1931

## 2019-02-05 NOTE — ED Triage Notes (Signed)
Pt presents to [facility] for assessment after sister tested positive this week, together on Christmas Eve.  Patient c/o now of fatigue, headache, coughs with deep inspiration, SOB while laying on back in bed, nasal congestion.

## 2019-02-06 LAB — NOVEL CORONAVIRUS, NAA: SARS-CoV-2, NAA: NOT DETECTED

## 2019-02-08 NOTE — Progress Notes (Deleted)
   Subjective:    Patient ID: [name], female    DOB: [birth year], 54 y.o.   MRN: [id number]  HPI:  [name] is here to establish as a new pt.  She is a pleasant 54 year old female. PMH:T2D, HTN, Dyspnea, Tobacco Use, Anxiety and Depression  Lab Results  Component Value Date   HGBA1C 7.1 (H) 10/16/2010    Patient Care Team    Relationship Specialty Notifications Start End  Patient, No Pcp Per PCP - General General Practice  07/02/17     Patient Active Problem List   Diagnosis Date Noted  . 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 Pruritic rash   . Allergic drug reaction   . Cellulitis of face 07/03/2017  . DM2 (diabetes mellitus, type 2) (HCC) 07/03/2017  . HTN (hypertension) 07/03/2017  . Dyspnea 07/10/2010  . Tobacco abuse 07/10/2010     Past Medical History:  Diagnosis Date  . Anxiety and depression   . Diabetes mellitus   . GERD (gastroesophageal reflux disease)   . HTN (hypertension)   . Hyperlipidemia   . Hypothyroidism      Past Surgical History:  Procedure Laterality Date  . Ankle surgery x 2    . Exploratory Laparoscopy    . PARTIAL HYSTERECTOMY       Family History  Problem Relation Age of Onset  . Heart attack Father   . Heart attack Paternal Uncle   . Heart attack Maternal Grandfather   . Heart attack Paternal Grandfather      Social History   Substance and Sexual Activity  Drug Use No     Social History   Substance and Sexual Activity  Alcohol Use Yes  . Alcohol/week: 1.0 standard drinks  . Types: 1 Standard drinks or equivalent per week     Social History   Tobacco Use  Smoking Status Current Every Day Smoker  . Packs/day: 1.00  . Types: Cigarettes  Smokeless Tobacco Never Used     Outpatient Encounter Medications as of 02/09/2019  Medication Sig  . fenofibrate 160 MG tablet Take 160 mg by mouth daily.  Marland Kitchen levothyroxine (SYNTHROID, LEVOTHROID) 200 MCG tablet Take 200 mcg by mouth daily.    . metFORMIN (GLUCOPHAGE)  500 MG tablet Take 2 tablets (1,000 mg total) by mouth 2 (two) times daily with a meal.  . sertraline (ZOLOFT) 50 MG tablet Take 50 mg by mouth daily.  . simvastatin (ZOCOR) 40 MG tablet Take 40 mg by mouth at bedtime.    . SYMBICORT 160-4.5 MCG/ACT inhaler Inhale 2 puffs into the lungs 2 (two) times daily.  . valsartan-hydrochlorothiazide (DIOVAN-HCT) 160-25 MG tablet Take 1 tablet by mouth daily.   No facility-administered encounter medications on file as of 02/09/2019.    Allergies: Compazine, Lasix [furosemide], and Vancomycin  There is no height or weight on file to calculate BMI.  There were no vitals taken for this visit.     Review of Systems     Objective:   Physical Exam        Assessment & Plan:  No diagnosis found.  No problem-specific Assessment & Plan notes found for this encounter.    FOLLOW-UP:  No follow-ups on file.

## 2019-02-09 ENCOUNTER — Ambulatory Visit: Payer: BC Managed Care – PPO | Admitting: Adult Health

## 2019-02-10 ENCOUNTER — Ambulatory Visit (HOSPITAL_BASED_OUTPATIENT_CLINIC_OR_DEPARTMENT_OTHER): Payer: BC Managed Care – PPO

## 2019-02-10 ENCOUNTER — Emergency Department (HOSPITAL_COMMUNITY): Payer: BC Managed Care – PPO

## 2019-02-10 ENCOUNTER — Encounter (HOSPITAL_COMMUNITY): Payer: Self-pay | Admitting: *Deleted

## 2019-02-10 ENCOUNTER — Emergency Department (HOSPITAL_COMMUNITY)
Admission: EM | Admit: 2019-02-10 | Discharge: 2019-02-10 | Disposition: A | Discharge status: Home / Self Care | Payer: BC Managed Care – PPO | Attending: Emergency Medicine | Admitting: Emergency Medicine

## 2019-02-10 DIAGNOSIS — E119 Type 2 diabetes mellitus without complications: Secondary | ICD-10-CM | POA: Insufficient documentation

## 2019-02-10 DIAGNOSIS — I10 Essential (primary) hypertension: Secondary | ICD-10-CM | POA: Insufficient documentation

## 2019-02-10 DIAGNOSIS — R0789 Other chest pain: Secondary | ICD-10-CM | POA: Diagnosis not present

## 2019-02-10 DIAGNOSIS — R0981 Nasal congestion: Secondary | ICD-10-CM | POA: Diagnosis not present

## 2019-02-10 DIAGNOSIS — R0602 Shortness of breath: Secondary | ICD-10-CM

## 2019-02-10 DIAGNOSIS — Z7984 Long term (current) use of oral hypoglycemic drugs: Secondary | ICD-10-CM | POA: Diagnosis not present

## 2019-02-10 DIAGNOSIS — M7989 Other specified soft tissue disorders: Secondary | ICD-10-CM | POA: Diagnosis not present

## 2019-02-10 DIAGNOSIS — R05 Cough: Secondary | ICD-10-CM | POA: Diagnosis not present

## 2019-02-10 DIAGNOSIS — E039 Hypothyroidism, unspecified: Secondary | ICD-10-CM | POA: Insufficient documentation

## 2019-02-10 DIAGNOSIS — Z79899 Other long term (current) drug therapy: Secondary | ICD-10-CM | POA: Insufficient documentation

## 2019-02-10 DIAGNOSIS — Z20822 Contact with and (suspected) exposure to covid-19: Secondary | ICD-10-CM

## 2019-02-10 DIAGNOSIS — R059 Cough, unspecified: Secondary | ICD-10-CM

## 2019-02-10 DIAGNOSIS — M7918 Myalgia, other site: Secondary | ICD-10-CM | POA: Diagnosis not present

## 2019-02-10 LAB — CBC WITH DIFFERENTIAL/PLATELET
Abs Immature Granulocytes: 0 10*3/uL (ref 0.00–0.07)
Basophils Absolute: 0 10*3/uL (ref 0.0–0.1)
Basophils Relative: 0 %
Eosinophils Absolute: 0.1 10*3/uL (ref 0.0–0.5)
Eosinophils Relative: 1 %
HCT: 53.7 % — ABNORMAL HIGH (ref 36.0–46.0)
Hemoglobin: 17.5 g/dL — ABNORMAL HIGH (ref 12.0–15.0)
Lymphocytes Relative: 31 %
Lymphs Abs: 3.1 10*3/uL (ref 0.7–4.0)
MCH: 30.4 pg (ref 26.0–34.0)
MCHC: 32.6 g/dL (ref 30.0–36.0)
MCV: 93.2 fL (ref 80.0–100.0)
Monocytes Absolute: 1.1 10*3/uL — ABNORMAL HIGH (ref 0.1–1.0)
Monocytes Relative: 11 %
Neutro Abs: 5.6 10*3/uL (ref 1.7–7.7)
Neutrophils Relative %: 57 %
Platelets: 292 10*3/uL (ref 150–400)
RBC: 5.76 MIL/uL — ABNORMAL HIGH (ref 3.87–5.11)
RDW: 13.2 % (ref 11.5–15.5)
WBC: 9.9 10*3/uL (ref 4.0–10.5)
nRBC: 0 % (ref 0.0–0.2)
nRBC: 0 /100 WBC

## 2019-02-10 LAB — TROPONIN I (HIGH SENSITIVITY): Troponin I (High Sensitivity): 9 ng/L (ref <18)

## 2019-02-10 LAB — BASIC METABOLIC PANEL
Anion gap: 12 (ref 5–15)
BUN: 15 mg/dL (ref 6–20)
CO2: 28 mmol/L (ref 22–32)
Calcium: 9.5 mg/dL (ref 8.9–10.3)
Chloride: 92 mmol/L — ABNORMAL LOW (ref 98–111)
Creatinine, Ser: 0.76 mg/dL (ref 0.44–1.00)
GFR calc Af Amer: >60 mL/min (ref >60)
GFR calc non Af Amer: >60 mL/min (ref >60)
Glucose, Bld: 301 mg/dL — ABNORMAL HIGH (ref 70–99)
Potassium: 4 mmol/L (ref 3.5–5.1)
Sodium: 132 mmol/L — ABNORMAL LOW (ref 135–145)

## 2019-02-10 LAB — SARS CORONAVIRUS 2 (TAT 6-24 HRS): SARS Coronavirus 2: NEGATIVE

## 2019-02-10 LAB — D-DIMER, QUANTITATIVE: D-Dimer, Quant: 0.5 ug/mL-FEU (ref 0.00–0.50)

## 2019-02-10 LAB — BRAIN NATRIURETIC PEPTIDE: B Natriuretic Peptide: 17.8 pg/mL (ref 0.0–100.0)

## 2019-02-10 MED ORDER — ALBUTEROL SULFATE HFA 108 (90 BASE) MCG/ACT IN AERS
6.0000 | INHALATION_SPRAY | Freq: Once | RESPIRATORY_TRACT | Status: AC
  Administered 2019-02-10: 6 via RESPIRATORY_TRACT
  Filled 2019-02-10: qty 6.7

## 2019-02-10 MED ORDER — ALBUTEROL SULFATE HFA 108 (90 BASE) MCG/ACT IN AERS: 2.0000 | INHALATION_SPRAY | RESPIRATORY_TRACT | 0 refills | Status: DC | PRN

## 2019-02-10 MED ORDER — BENZONATATE 100 MG PO CAPS: 100.0000 mg | ORAL_CAPSULE | Freq: Three times a day (TID) | ORAL | 0 refills | Status: DC

## 2019-02-10 MED ORDER — PREDNISONE 20 MG PO TABS: 60.0000 mg | ORAL_TABLET | Freq: Every day | ORAL | 0 refills | Status: AC

## 2019-02-10 NOTE — ED Provider Notes (Addendum)
[facility] EMERGENCY DEPARTMENT Provider Note   CSN: [id number] Arrival date & time: 02/10/19  3329    History Chief Complaint  Patient presents with  . Shortness of Breath    [name] is a 54 y.o. female with past medical history significant for anxiety, GERD, hypertension, hyperlipidemia, diabetes who presents for evaluation of multiple complaints. Patient states she has had intermittent shortness of breath as well as chest pain since she was exposed to Covid on 01/26/2019. Patient states she was tested on urgent care 1/3 and was negative. Patient states she has had some body aches and pains, congestion, rhinorrhea, pleuritic chest pain, shortness of breath. Patient states she does have history of COPD has been using Symbicort and albuterol at home. Patient states two people she was with on Christmas Eve both tested positive for Covid and she was sitting next to them at the dinner table. Patient states she has noticed some swelling to her lower extremities, right greater than left. Patient states she normally has swelling to her lower extremities which resolves when she elevates them. She does have a family history, mother who has had her recurrent blood clots. Patient denies recent surgery, immobilization, personal history of PE, DVT as well as exogenous hormone use. Denies additional aggravating or alleviating factors  History obtained from patient and past medical records. No interpreter is used.  HPI     Past Medical History:  Diagnosis Date  . Anxiety and depression   . Diabetes mellitus   . GERD (gastroesophageal reflux disease)   . HTN (hypertension)   . Hyperlipidemia   . Hypothyroidism     Patient Active Problem List   Diagnosis Date Noted  . 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 Pruritic rash   . Allergic drug reaction   . Cellulitis of face 07/03/2017  . DM2 (diabetes mellitus, type 2) (HCC) 07/03/2017  . HTN (hypertension) 07/03/2017  .  Dyspnea 07/10/2010  . Tobacco abuse 07/10/2010    Past Surgical History:  Procedure Laterality Date  . Ankle surgery x 2    . Exploratory Laparoscopy    . PARTIAL HYSTERECTOMY       OB History   No obstetric history on file.     Family History  Problem Relation Age of Onset  . Heart attack Father   . Heart attack Paternal Uncle   . Heart attack Maternal Grandfather   . Heart attack Paternal Grandfather     Social History   Tobacco Use  . Smoking status: Current Every Day Smoker    Packs/day: 1.00    Types: Cigarettes  . Smokeless tobacco: Never Used  Substance Use Topics  . Alcohol use: Yes    Alcohol/week: 1.0 standard drinks    Types: 1 Standard drinks or equivalent per week  . Drug use: No    Home Medications Prior to Admission medications   Medication Sig Start Date End Date Taking? Authorizing Provider  albuterol (VENTOLIN HFA) 108 (90 Base) MCG/ACT inhaler Inhale 2 puffs into the lungs every 4 (four) hours as needed for wheezing or shortness of breath. 02/10/19   Jarquis Walker A, PA-C  benzonatate (TESSALON) 100 MG capsule Take 1 capsule (100 mg total) by mouth every 8 (eight) hours. 02/10/19   Sanay Belmar A, PA-C  fenofibrate 160 MG tablet Take 160 mg by mouth daily. 06/28/17   [provider]  levothyroxine (SYNTHROID, LEVOTHROID) 200 MCG tablet Take 200 mcg by mouth daily.  [provider]  metFORMIN (GLUCOPHAGE) 500 MG tablet Take 2 tablets (1,000 mg total) by mouth 2 (two) times daily with a meal. 02/05/19 03/07/19  Hall-Potvin, [place], PA-C  predniSONE (DELTASONE) 20 MG tablet Take 3 tablets (60 mg total) by mouth daily for 5 days. 02/10/19 02/15/19  Janaiah Vetrano A, PA-C  sertraline (ZOLOFT) 50 MG tablet Take 50 mg by mouth daily. 02/26/17   [provider]  simvastatin (ZOCOR) 40 MG tablet Take 40 mg by mouth at bedtime.      [provider]  SYMBICORT 160-4.5 MCG/ACT inhaler Inhale 2 puffs into the lungs 2 (two)  times daily. 02/05/19   Hall-Potvin, [place], PA-C  valsartan-hydrochlorothiazide (DIOVAN-HCT) 160-25 MG tablet Take 1 tablet by mouth daily. 02/05/19   Hall-Potvin, [place], PA-C    Allergies    Compazine, Lasix [furosemide], and Vancomycin  Review of Systems   Review of Systems  Constitutional: Positive for activity change and fatigue. Negative for appetite change, chills, diaphoresis and fever.  HENT: Positive for congestion, postnasal drip and rhinorrhea. Negative for sinus pressure, sinus pain, sneezing, sore throat, trouble swallowing and voice change.   Respiratory: Positive for cough, shortness of breath and wheezing. Negative for apnea, choking, chest tightness and stridor.   Cardiovascular: Positive for chest pain (Pleuritic CP) and leg swelling (BLE R>L). Negative for palpitations.  Gastrointestinal: Negative.   Musculoskeletal: Negative.   Neurological: Negative.   All other systems reviewed and are negative.   Physical Exam Updated Vital Signs BP (!) 146/106   Pulse 89   Temp 97.8 F (36.6 C) (Oral)   Resp 18   SpO2 94%   Physical Exam Vitals and nursing note reviewed.  Constitutional:      General: She is not in acute distress.    Appearance: She is not ill-appearing, toxic-appearing or diaphoretic.  HENT:     Head: Normocephalic and atraumatic.     Jaw: There is normal jaw occlusion.     Right Ear: Tympanic membrane, ear canal and external ear normal. There is no impacted cerumen. No hemotympanum. Tympanic membrane is not injected, scarred, perforated, erythematous, retracted or bulging.     Left Ear: Tympanic membrane, ear canal and external ear normal. There is no impacted cerumen. No hemotympanum. Tympanic membrane is not injected, scarred, perforated, erythematous, retracted or bulging.     Ears:     Comments: No Mastoid tenderness.    Nose:     Comments: Clear rhinorrhea and congestion to bilateral nares.  No sinus tenderness.    Mouth/Throat:      Comments: Posterior oropharynx clear.  Mucous membranes moist.  Tonsils without erythema or exudate.  Uvula midline without deviation.  No evidence of PTA or RPA.  No drooling, dysphasia or trismus.  Phonation normal. Neck:     Trachea: Trachea and phonation normal.     Meningeal: Brudzinski's sign and Kernig's sign absent.     Comments: No Neck stiffness or neck rigidity.  No meningismus.  No cervical lymphadenopathy. Cardiovascular:     Rate and Rhythm: Normal rate.     Pulses: Normal pulses.          Dorsalis pedis pulses are 2+ on the right side and 2+ on the left side.       Posterior tibial pulses are 2+ on the right side and 2+ on the left side.     Heart sounds: Normal heart sounds.     Comments: No murmurs rubs or gallops. Pulmonary:  Effort: Pulmonary effort is normal. No accessory muscle usage or respiratory distress.     Breath sounds: Normal breath sounds.     Comments: Mild diffuse expiratory wheeze.  No accessory muscle usage.  Able speak in full sentences. Abdominal:     Comments: Soft, nontender without rebound or guarding.  No CVA tenderness.  Musculoskeletal:     Comments: Moves all 4 extremities without difficulty.  Lower extremities without erythema or warmth.  Compartments soft.  Feet:     Right foot:     Protective Sensation: 2 sites tested. 2 sites sensed.     Left foot:     Protective Sensation: 2 sites tested. 2 sites sensed.     Comments: Trace pitting edema to left lower extremity from shin distally, patient with 1+ pitting edema to shin distally to right lower extremity. Skin:    General: Skin is warm.     Capillary Refill: Capillary refill takes less than 2 seconds.     Comments: Brisk capillary refill.  No rashes or lesions.  Neurological:     Mental Status: She is alert.     Cranial Nerves: Cranial nerves are intact.     Sensory: Sensation is intact.     Motor: Motor function is intact.     Coordination: Coordination is intact.     Gait: Gait is  intact.     Comments: Ambulatory in department without difficulty.  Cranial nerves II through XII grossly intact.  No facial droop.  No aphasia.    ED Results / Procedures / Treatments   Labs (all labs ordered are listed, but only abnormal results are displayed) Labs Reviewed  CBC WITH DIFFERENTIAL/PLATELET - Abnormal; Notable for the following components:      Result Value   RBC 5.76 (*)    Hemoglobin 17.5 (*)    HCT 53.7 (*)    Monocytes Absolute 1.1 (*)    All other components within normal limits  BASIC METABOLIC PANEL - Abnormal; Notable for the following components:   Sodium 132 (*)    Chloride 92 (*)    Glucose, Bld 301 (*)    All other components within normal limits  SARS CORONAVIRUS 2 (TAT 6-24 HRS)  BRAIN NATRIURETIC PEPTIDE  D-DIMER, QUANTITATIVE (NOT AT [facility])  TROPONIN I (HIGH SENSITIVITY)    EKG EKG Interpretation  Date/Time:  Friday February 10 2019 09:29:35 EST Ventricular Rate:  92 PR Interval:  126 QRS Duration: 142 QT Interval:  416 QTC Calculation: 514 R Axis:   136 Text Interpretation: Sinus rhythm with Premature atrial complexes Right bundle branch block Abnormal ECG Confirmed by Vanetta Mulders [phone]) on 02/10/2019 2:23:00 PM   Radiology DG Chest 2 View  Result Date: 02/10/2019 CLINICAL DATA:  Shortness of breath. EXAM: CHEST - 2 VIEW COMPARISON:  10/15/2010 FINDINGS: Normal heart size and mediastinal contours. Mild interstitial coarsening, likely from COPD. There is no edema, consolidation, effusion, or pneumothorax. IMPRESSION: No evidence of active disease. Electronically Signed   By: Marnee Spring M.D.   On: 02/10/2019 09:53   VAS [place] LOWER EXTREMITY VENOUS (DVT) (ONLY [facility] & [facility])  Result Date: 02/10/2019  Lower Venous Study Indications: Swelling, and SOB.  Comparison Study: No prior study. Performing Technologist: Kennedy Bucker ARDMS, RVT  Examination Guidelines: A complete evaluation includes B-mode imaging, spectral Doppler, color Doppler, and  power Doppler as needed of all accessible portions of each vessel. Bilateral testing is considered an integral part of a complete examination. Limited examinations for reoccurring indications  may be performed as noted.  +---------+---------------+---------+-----------+----------+--------------+ RIGHT    CompressibilityPhasicitySpontaneityPropertiesThrombus Aging +---------+---------------+---------+-----------+----------+--------------+ CFV      Full           Yes      Yes                                 +---------+---------------+---------+-----------+----------+--------------+ SFJ      Full                                                        +---------+---------------+---------+-----------+----------+--------------+ FV Prox  Full                                                        +---------+---------------+---------+-----------+----------+--------------+ FV Mid   Full                                                        +---------+---------------+---------+-----------+----------+--------------+ FV DistalFull                                                        +---------+---------------+---------+-----------+----------+--------------+ PFV      Full                                                        +---------+---------------+---------+-----------+----------+--------------+ POP      Full           Yes      Yes                                 +---------+---------------+---------+-----------+----------+--------------+ PTV      Full                                                        +---------+---------------+---------+-----------+----------+--------------+ PERO     Full                                                        +---------+---------------+---------+-----------+----------+--------------+   +----+---------------+---------+-----------+----------+--------------+  LEFTCompressibilityPhasicitySpontaneityPropertiesThrombus Aging +----+---------------+---------+-----------+----------+--------------+ CFV Full           Yes      Yes                                 +----+---------------+---------+-----------+----------+--------------+  Summary: Right: There is no evidence of deep vein thrombosis in the lower extremity. Left: No evidence of common femoral vein obstruction.  *See table(s) above for measurements and observations.    Preliminary    Procedures Procedures (including critical care time)  Medications Ordered in ED Medications  albuterol (VENTOLIN HFA) 108 (90 Base) MCG/ACT inhaler 6 puff (6 puffs Inhalation Given 02/10/19 1408)   ED Course  I have reviewed the triage vital signs and the nursing notes.  Pertinent labs & imaging results that were available during my care of the patient were reviewed by me and considered in my medical decision making (see chart for details).  54 year old female appears otherwise well presents for evaluation of upper respiratory complaints as well as pleuritic chest pain, cough, congestion, body aches and pains and rhinorrhea.  Had negative outpatient Covid test.  Patient has chronic lower extremity edema which has not changed from her baseline however she does have additional edema right leg greater than left.  No personal history of PE, DVT, recent surgery or immobilization however does have a mother has history of PE.  No exogenous hormone use.  She does have some diffuse expiratory wheezing.  No respiratory distress.  Speaks in full sentences without difficulty.  She is afebrile, nonseptic, non-ill-appearing.  No tachycardia, tachypnea or hypoxia.  No exertional chest pain, chest pain radiating to left arm, jaw, diaphoresis, lightheadedness, dizziness or nausea.  She is tolerating p.o. intake at home without difficulty.  Labs and imaging personally reviewed and interpreted:  Clinical Course as of Feb 10 1627  Fri Feb 10, 2019  1434 Mild hyponatremia 132, glucose 301, has not taken her home Metformin, anion gap 12, no evidence of DKA  Basic metabolic panel(!) [BH]  1434 D-dimer not elevated  D-dimer, quantitative (not at [facility]) [BH]  1434 Troponin negative, if likely ACS is to be elevated at this time given symptom onset.  Troponin I (High Sensitivity) [BH]  1435 No leukocytosis, hemoglobin 17.5  CBC with Differential(!) [BH]  1435 No infiltrates, cardiomegaly upon pulmonary edema or pneumothorax.  DG Chest 2 View [BH]  1435 Right bundle branch, no ST/T changes.  No STEMI.  No tachycardia.  ED EKG [BH]  1445 Negative for DVT  VAS [place] LOWER EXTREMITY VENOUS (DVT) (ONLY [facility] & [facility]) [BH]    Clinical Course User Index [BH] Raylene Carmickle A, PA-C    [place] negative for DVT. No tachycardia, tachypnea, hypoxia in ED. DDimer not elevated. Low suspicion for PE as cause of her symptoms. Given patient, rhinorrhea, cough, body aches and pains likely viral upper respiratory infection.  Can retest for Covid.  Will DC home with symptomatic management.  No evidence of heart failure on her chest x-ray.  She does have some trace edema to lower extremities however she does not look like she is grossly in fluid overload. HEART SCORE 3- Risk factors, age, Anner Crete criteria low risk.  Patient is able to ambulate oxygen saturation 90-95% on room air.  Steady gait.  Patient is upset that she had a negative outpatient Covid test and she continues to have upper respiratory symptoms.  Discussed with patient different viral etiologies however it is reassuring she has had a negative troponin, reassuring D-dimer, stable vital signs as well as chest x-ray and labs.  I personally reevaluated patient who had oxygen saturations 93% on room air.  Will obtain additional outpatient Covid test.  Will treat for viral upper respiratory infection.  She did have some wheeze consistent  with COPD exacerbation when she first arrived however this  resolved with albuterol.  Her chest x-ray and BNP are not elevated I have low suspicion for congestive heart failure.  She has no PND orthopnea.  Given she has no tachycardia, tachypnea or hypoxia I have low suspicion for PE given reassuring D-dimer.  Patient does have some mild trace edema to lower extremities right greater than left however patient states this is at baseline and chronic.  She had negative DVT study here in the ED.  No evidence of infectious process.  Will treat symptomatically for viral upper respiratory infection.  Discussed return precautions with patient.  Patient voiced understanding agreeable for follow-up.  She is to return to emergency department for new or worsening symptoms.  Wells criteria low risk, heart score 2-risk factors, age.  Chest pain is not likely of cardiac or pulmonary etiology d/t presentation, PERC negative, VSS, no tracheal deviation, no JVD or new murmur, RRR, breath sounds equal bilaterally, EKG without acute abnormalities, negative troponin, and negative CXR. Pt has been advised to return to the ED if CP becomes exertional, associated with diaphoresis or nausea, radiates to left jaw/arm, worsens or becomes concerning in any way. Pt appears reliable for follow up and is agreeable to discharge.   The patient has been appropriately medically screened and/or stabilized in the ED. I have low suspicion for any other emergent medical condition which would require further screening, evaluation or treatment in the ED or require inpatient management.  Patient is hemodynamically stable and in no acute distress.  Patient able to ambulate in department prior to ED.  Evaluation does not show acute pathology that would require ongoing or additional emergent interventions while in the emergency department or further inpatient treatment.  I have discussed the diagnosis with the patient and answered all questions.  Pain is been managed while in the emergency department and  patient has no further complaints prior to discharge.  Patient is comfortable with plan discussed in room and is stable for discharge at this time.  I have discussed strict return precautions for returning to the emergency department.  Patient was encouraged to follow-up with PCP/specialist refer to at discharge.   MDM Rules/Calculators/A&P                     Lenon CurtKimberly M Hunger was evaluated in Emergency Department on 02/10/2019 for the symptoms described in the history of present illness. She was evaluated in the context of the global COVID-19 pandemic, which necessitated consideration that the patient might be at risk for infection with the SARS-CoV-2 virus that causes COVID-19. Institutional protocols and algorithms that pertain to the evaluation of patients at risk for COVID-19 are in a state of rapid change based on information released by regulatory bodies including the CDC and federal and state organizations. These policies and algorithms were followed during the patient's care in the ED. Final Clinical Impression(s) / ED Diagnoses Final diagnoses:  Shortness of breath  Cough  Close exposure to COVID-19 virus    Rx / DC Orders ED Discharge Orders         Ordered    benzonatate (TESSALON) 100 MG capsule  Every 8 hours     02/10/19 1618    predniSONE (DELTASONE) 20 MG tablet  Daily     02/10/19 1618    albuterol (VENTOLIN HFA) 108 (90 Base) MCG/ACT inhaler  Every 4 hours PRN     02/10/19 1618  Ami Mally A, PA-C 02/10/19 1628    Martavious Hartel A, PA-C 02/10/19 1629    Vanetta MuldersZackowski, Scott, MD 02/24/19 1525

## 2019-02-10 NOTE — ED Notes (Signed)
The pt is angry about something

## 2019-02-10 NOTE — ED Notes (Signed)
The pt reports that she is frustrated at prfesent

## 2019-02-10 NOTE — ED Notes (Addendum)
Pt ambulated in exam room on pulse ox. O2 saturation stayed between 90-95%. Gait was steady.  During this task, pt started to cry and stated that she was very frustrated with her visit because all her results look good and are negative but she is still having SHOB. "They can't tell me anything," pt stated.  EDP, H[place], notified and is at bedside with the pt now.  Pt's O2 saturation is maintaining at 93% on room air with good pleth.

## 2019-02-10 NOTE — ED Notes (Signed)
US at bedside

## 2019-02-10 NOTE — Progress Notes (Signed)
Right lower extremity venous duplex completed.  Preliminary results can be found under CV proc under chart review.  02/10/2019 2:55 PM  Vitalia Stough, K., RDMS, RVT

## 2019-02-10 NOTE — ED Triage Notes (Signed)
To ED for eval sob, intermittent cp, and htn. States her sister tested pos for covid 12/24. [facility] and tested 1/3 and negative. Pt states she has been off of bp meds for past month. Tried to go to pcp yesterday but they wouldn't see her until 10day post covid test per pt. Describes intermittent cp - described as a 'twinge'. Pt became tearful when talking about cp. No pain now. Speaks in full extended sentences without difficulty. Appears in nad.

## 2019-02-10 NOTE — Discharge Instructions (Signed)
Take medications as prescribed.  Return to the ED for new worsening symptoms.

## 2019-02-10 NOTE — ED Notes (Addendum)
Pt satting 90-94%, put on 2L [place].

## 2019-02-22 ENCOUNTER — Ambulatory Visit: Payer: BC Managed Care – PPO | Admitting: Adult Health

## 2019-02-22 ENCOUNTER — Encounter: Payer: Self-pay | Admitting: Adult Health

## 2019-02-22 ENCOUNTER — Telehealth: Payer: Self-pay

## 2019-02-22 ENCOUNTER — Other Ambulatory Visit: Payer: Self-pay

## 2019-02-22 VITALS — BP 110/76 | HR 94 | Temp 98.3°F | Ht 63.5 in | Wt 197.7 lb

## 2019-02-22 DIAGNOSIS — Z9189 Other specified personal risk factors, not elsewhere classified: Secondary | ICD-10-CM

## 2019-02-22 DIAGNOSIS — Z1159 Encounter for screening for other viral diseases: Secondary | ICD-10-CM

## 2019-02-22 DIAGNOSIS — E1169 Type 2 diabetes mellitus with other specified complication: Secondary | ICD-10-CM

## 2019-02-22 DIAGNOSIS — E119 Type 2 diabetes mellitus without complications: Secondary | ICD-10-CM | POA: Diagnosis not present

## 2019-02-22 DIAGNOSIS — Z23 Encounter for immunization: Secondary | ICD-10-CM

## 2019-02-22 DIAGNOSIS — Z122 Encounter for screening for malignant neoplasm of respiratory organs: Secondary | ICD-10-CM

## 2019-02-22 DIAGNOSIS — F17200 Nicotine dependence, unspecified, uncomplicated: Secondary | ICD-10-CM

## 2019-02-22 DIAGNOSIS — Z1211 Encounter for screening for malignant neoplasm of colon: Secondary | ICD-10-CM

## 2019-02-22 DIAGNOSIS — F329 Major depressive disorder, single episode, unspecified: Secondary | ICD-10-CM

## 2019-02-22 DIAGNOSIS — E785 Hyperlipidemia, unspecified: Secondary | ICD-10-CM

## 2019-02-22 DIAGNOSIS — F32A Depression, unspecified: Secondary | ICD-10-CM

## 2019-02-22 DIAGNOSIS — I10 Essential (primary) hypertension: Secondary | ICD-10-CM

## 2019-02-22 DIAGNOSIS — Z8249 Family history of ischemic heart disease and other diseases of the circulatory system: Secondary | ICD-10-CM

## 2019-02-22 DIAGNOSIS — Z1231 Encounter for screening mammogram for malignant neoplasm of breast: Secondary | ICD-10-CM | POA: Diagnosis not present

## 2019-02-22 DIAGNOSIS — E1165 Type 2 diabetes mellitus with hyperglycemia: Secondary | ICD-10-CM

## 2019-02-22 DIAGNOSIS — F419 Anxiety disorder, unspecified: Secondary | ICD-10-CM

## 2019-02-22 DIAGNOSIS — Z136 Encounter for screening for cardiovascular disorders: Secondary | ICD-10-CM

## 2019-02-22 DIAGNOSIS — Z0000 Encounter for general adult medical examination without abnormal findings: Secondary | ICD-10-CM | POA: Insufficient documentation

## 2019-02-22 DIAGNOSIS — Z720 Tobacco use: Secondary | ICD-10-CM

## 2019-02-22 LAB — POCT UA - MICROALBUMIN
Albumin/Creatinine Ratio, Urine, POC: >300
Creatinine, POC: 50 mg/dL
Microalbumin Ur, POC: 80 mg/L

## 2019-02-22 LAB — POCT GLYCOSYLATED HEMOGLOBIN (HGB A1C): Hemoglobin A1C: 13.3 % — AB (ref 4.0–5.6)

## 2019-02-22 MED ORDER — AMOXICILLIN-POT CLAVULANATE 875-125 MG PO TABS: 1.0000 | ORAL_TABLET | Freq: Two times a day (BID) | ORAL | 0 refills | Status: DC

## 2019-02-22 MED ORDER — DULAGLUTIDE 1.5 MG/0.5ML ~~LOC~~ SOAJ: 0.5000 mL | SUBCUTANEOUS | 1 refills | Status: DC

## 2019-02-22 MED ORDER — BUSPIRONE HCL 7.5 MG PO TABS: 7.5000 mg | ORAL_TABLET | Freq: Two times a day (BID) | ORAL | 1 refills | Status: DC

## 2019-02-22 MED ORDER — BLOOD GLUCOSE METER KIT: PACK | 0 refills | Status: AC

## 2019-02-22 MED ORDER — METFORMIN HCL 1000 MG PO TABS: 1000.0000 mg | ORAL_TABLET | Freq: Two times a day (BID) | ORAL | 1 refills | Status: DC

## 2019-02-22 MED ORDER — FENOFIBRATE 160 MG PO TABS: 160.0000 mg | ORAL_TABLET | Freq: Every day | ORAL | 0 refills | Status: DC

## 2019-02-22 MED ORDER — ROSUVASTATIN CALCIUM 40 MG PO TABS: 40.0000 mg | ORAL_TABLET | Freq: Every day | ORAL | 0 refills | Status: DC

## 2019-02-22 MED ORDER — BUPROPION HCL ER (XL) 300 MG PO TB24: 300.0000 mg | ORAL_TABLET | Freq: Every day | ORAL | 0 refills | Status: DC

## 2019-02-22 MED ORDER — SERTRALINE HCL 100 MG PO TABS: 100.0000 mg | ORAL_TABLET | Freq: Every day | ORAL | 0 refills | Status: DC

## 2019-02-22 MED ORDER — VALSARTAN-HYDROCHLOROTHIAZIDE 160-25 MG PO TABS: 1.0000 | ORAL_TABLET | Freq: Every day | ORAL | 0 refills | Status: DC

## 2019-02-22 NOTE — Assessment & Plan Note (Signed)
Denies SI/HI Re-started on Sertraline 100mg  QD, Buspar 7.5mg  BID Wellbutrin 300mg  - was for tobacco cessation- pt has not been on for >2 months, smoking pack/day- decline re-starting today.

## 2019-02-22 NOTE — Progress Notes (Signed)
Subjective:    Patient ID: [name], female    DOB: 1965/12/24, 54 y.o.   MRN: [id number]  HPI:  [name] is here to establish as a new pt.  She is a pleasant 54 year old female. PMH: Uncontrolled T2D- A1c today 13.3 HTN, HLD, Depression and Anxiety, Hypothyroidism, Tobacco use She smokes pack/day- declined tobacco cessation. She was on Wellbutrin XL 300mg  QD for tobacco cessation, however it did not help curb cravings and she has been off Rx for >2 months.  Last OV/telemedicine with PCP 09/2018-visit for acute illness. She has been seen by [facility]-02/05/2019 SARS-CoV-2 Neg ED 1/8/2021SARS-CoV-2 Neg She was treated with Tessalon, Prednisone, Albuterol She continues to experience copious clear/yellow nasal drainage, frontal HA (3/10, dull ache), non-productive cough, and mild dyspnea. Sx's present >3 weeks.  She has not been checking BG or BP at home. Discussed at length the importance of getting control of her BG- A1c 13.3 today. Emphasized diet/daily walking to augment the anti-diabetic Rx.  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  She reports a lot of stress/anxiety about not being at work. Discussed that she needs to remain home while she has active sx's.  Patient Care Team    Relationship Specialty Notifications Start End  Default, Provider, MD PCP - General   02/13/19     Patient Active Problem List   Diagnosis Date Noted  . Healthcare maintenance 02/22/2019  . Anxiety and depression 02/22/2019  . Uncontrolled diabetes mellitus (HCC) 07/03/2017  . HTN (hypertension) 07/03/2017   . Tobacco abuse 07/10/2010     Past Medical History:  Diagnosis Date  . Anxiety and depression   . Diabetes mellitus   . GERD (gastroesophageal reflux disease)   . HTN (hypertension)   . Hyperlipidemia   . Hypothyroidism      Past Surgical History:  Procedure Laterality Date  . ABDOMINAL HYSTERECTOMY    . Ankle surgery x 2    . Exploratory Laparoscopy    . PARTIAL HYSTERECTOMY       Family History  Problem Relation Age of Onset  . Heart attack Father   . Diabetes Father   . Hyperlipidemia Father   . Heart attack Paternal Uncle   . Heart attack Maternal Grandfather   . Hyperlipidemia Maternal Grandfather   . Heart attack Paternal Grandfather   . Hyperlipidemia Paternal Grandfather   . Stroke Maternal Grandmother   . Hyperlipidemia Maternal Grandmother   . Stroke Paternal Grandmother   . Hyperlipidemia Paternal Grandmother      Social History   Substance and Sexual Activity  Drug Use No     Social History   Substance and Sexual Activity  Alcohol Use Not Currently     Social History   Tobacco Use  Smoking Status Current Every Day Smoker  . Packs/day: 1.00  . Years: 40.00  . Pack years: 40.00  . Types: Cigarettes  Smokeless Tobacco Never Used     Outpatient Encounter Medications as of 02/22/2019  Medication Sig  . albuterol (VENTOLIN HFA) 108 (90 Base) MCG/ACT inhaler Inhale 2  puffs into the lungs every 4 (four) hours as needed for wheezing or shortness of breath.  . busPIRone (BUSPAR) 7.5 MG tablet Take 1 tablet (7.5 mg total) by mouth 2 (two) times daily.  . Dulaglutide 1.5 MG/0.5ML SOPN Inject 0.5 mLs into the skin once a week.  . fenofibrate 160 MG tablet Take 1 tablet (160 mg total) by mouth daily.  Marland Kitchen levothyroxine (SYNTHROID, LEVOTHROID) 200 MCG tablet Take 200 mcg by mouth daily.    . metFORMIN (GLUCOPHAGE) 1000 MG tablet Take 1 tablet (1,000 mg total) by mouth 2 (two) times daily with a meal.  . rosuvastatin (CRESTOR) 40 MG tablet Take 1  tablet (40 mg total) by mouth daily.  . sertraline (ZOLOFT) 100 MG tablet Take 1 tablet (100 mg total) by mouth daily.  . SYMBICORT 160-4.5 MCG/ACT inhaler Inhale 2 puffs into the lungs 2 (two) times daily.  . valsartan-hydrochlorothiazide (DIOVAN-HCT) 160-25 MG tablet Take 1 tablet by mouth daily.  . [DISCONTINUED] buPROPion (WELLBUTRIN XL) 300 MG 24 hr tablet Take 300 mg by mouth daily.  . [DISCONTINUED] buPROPion (WELLBUTRIN XL) 300 MG 24 hr tablet Take 1 tablet (300 mg total) by mouth daily.  . [DISCONTINUED] busPIRone (BUSPAR) 7.5 MG tablet Take 7.5 mg by mouth 2 (two) times daily.  . [DISCONTINUED] Dulaglutide 1.5 MG/0.5ML SOPN Inject 0.5 mLs into the skin once a week.  . [DISCONTINUED] fenofibrate 160 MG tablet Take 160 mg by mouth daily.  . [DISCONTINUED] meloxicam (MOBIC) 15 MG tablet Take 15 mg by mouth daily.  . [DISCONTINUED] metFORMIN (GLUCOPHAGE) 500 MG tablet Take 2 tablets (1,000 mg total) by mouth 2 (two) times daily with a meal.  . [DISCONTINUED] rosuvastatin (CRESTOR) 40 MG tablet Take 40 mg by mouth daily.  . [DISCONTINUED] sertraline (ZOLOFT) 100 MG tablet Take 1 tablet by mouth daily.  . [DISCONTINUED] valsartan-hydrochlorothiazide (DIOVAN-HCT) 160-25 MG tablet Take 1 tablet by mouth daily.  Marland Kitchen amoxicillin-clavulanate (AUGMENTIN) 875-125 MG tablet Take 1 tablet by mouth 2 (two) times daily.  . [DISCONTINUED] benzonatate (TESSALON) 100 MG capsule Take 1 capsule (100 mg total) by mouth every 8 (eight) hours.   No facility-administered encounter medications on file as of 02/22/2019.    Allergies: Compazine  [prochlorperazine], Thiethylperazine, Vancomycin, Compazine, and Lasix [furosemide]  Body mass index is 34.47 kg/m.  Blood pressure 110/76, pulse 94, temperature 98.3 F (36.8 C), temperature source Oral, height 5' 3.5" (1.613 m), weight 197 lb 11.2 oz (89.7 kg), SpO2 96 %.   Review of Systems  Constitutional: Positive for fatigue. Negative for activity change,  appetite change, chills, diaphoresis, fever and unexpected weight change.  HENT: Positive for congestion, postnasal drip, sinus pressure, sinus pain and sore throat.   Eyes: Negative for visual disturbance.  Respiratory: Positive for cough and shortness of breath. Negative for chest tightness and wheezing.   Cardiovascular: Negative for chest pain, palpitations and leg swelling.  Gastrointestinal: Negative for abdominal distention, abdominal pain, blood in stool, constipation, diarrhea, nausea and vomiting.  Endocrine: Positive for polydipsia and polyphagia. Negative for polyuria.  Genitourinary: Negative for difficulty urinating and flank pain.  Musculoskeletal: Positive for arthralgias and back pain.  Neurological: Positive for headaches. Negative for dizziness.  Psychiatric/Behavioral: Positive for dysphoric mood. Negative for agitation, behavioral problems, confusion, decreased concentration, hallucinations, self-injury, sleep disturbance and suicidal ideas. The patient is nervous/anxious. The patient is not hyperactive.        Objective:   Physical Exam Vitals and nursing note reviewed.  Constitutional:      General:  She is not in acute distress.    Appearance: Normal appearance. She is obese. She is not ill-appearing, toxic-appearing or diaphoretic.  HENT:     Head: Normocephalic and atraumatic.     Right Ear: No decreased hearing noted. Tympanic membrane is erythematous and bulging.     Left Ear: No decreased hearing noted. Tympanic membrane is erythematous and bulging.     Nose: Mucosal edema, congestion and rhinorrhea present. No nasal tenderness.     Right Turbinates: Swollen.     Left Turbinates: Swollen.     Right Sinus: Maxillary sinus tenderness and frontal sinus tenderness present.     Left Sinus: Maxillary sinus tenderness and frontal sinus tenderness present.     Mouth/Throat:     Mouth: Mucous membranes are dry.     Pharynx: Posterior oropharyngeal erythema present.      Tonsils: No tonsillar exudate. 1+ on the right. 0 on the left.  Eyes:     Extraocular Movements: Extraocular movements intact.     Conjunctiva/sclera: Conjunctivae normal.     Pupils: Pupils are equal, round, and reactive to light.  Cardiovascular:     Rate and Rhythm: Normal rate and regular rhythm.     Pulses: Normal pulses.     Heart sounds: Normal heart sounds. No murmur. No friction rub. No gallop.   Pulmonary:     Effort: Pulmonary effort is normal.     Breath sounds: Examination of the right-upper field reveals wheezing. Examination of the left-upper field reveals wheezing. Examination of the right-middle field reveals wheezing. Examination of the left-middle field reveals wheezing. Wheezing present. No decreased breath sounds, rhonchi or rales.  Skin:    General: Skin is warm and dry.     Capillary Refill: Capillary refill takes less than 2 seconds.  Neurological:     Mental Status: She is alert and oriented to person, place, and time.  Psychiatric:        Attention and Perception: Attention and perception normal.        Mood and Affect: Affect is tearful.        Speech: Speech normal.        Behavior: Behavior normal.        Thought Content: Thought content normal.        Cognition and Memory: Cognition and memory normal.        Judgment: Judgment normal.       Assessment & Plan:   1. Type 2 diabetes mellitus without complication, unspecified whether long term insulin use (HCC)   2. Screening for colon cancer   3. Encounter for screening mammogram for malignant neoplasm of breast   4. Need for Tdap vaccination   5. Screening for AAA (abdominal aortic aneurysm)   6. Encounter for screening for malignant neoplasm of lung in current smoker with 30 pack year history or greater   7. Hypertension, unspecified type   8. Hyperlipidemia associated with type 2 diabetes mellitus (HCC)   9. Encounter for hepatitis C virus screening test for high risk patient   10. Healthcare  maintenance   11. Tobacco abuse   12. Uncontrolled type 2 diabetes mellitus with hyperglycemia (HCC)   13. Anxiety and depression     Healthcare maintenance Medications refilled, with one change- Metformin increased from 500mg  twice daily to 1000mg  twice daily. Please call you insurance and inquire which glucometer is covered-then call with that information. Please check your blood sugar each morning and several times in afternoon. Increase daily  walking, remain well hydrated, follow diabetic diet. Augmentin to treat sinus infection. Referral to Cardiology placed for evaluation. Continue to social distance and wear a mask when in public. Follow-up with primary care in 6 weeks.  Tobacco abuse Pack/day- declined tobacco cessation.   HTN (hypertension) BP at goal 110/76, HR 94 She was recently re-started on Valsartan/HCTZ 160/25mg  QD   Uncontrolled diabetes mellitus (HCC) Lab Results  Component Value Date   HGBA1C 13.3 (A) 02/22/2019   HGBA1C 7.1 (H) 10/16/2010  She has not been checking BG at home Instructed to call her insurance and inquire which glucometer is covered under her insurance plan-then call us so we can send in. Metformin 500mg  BID increased to 1000mg  BID Dulaglutide re-started 1.5mg  injection once weekly BMP 02/10/2019 Creat 0.76 GFR >60 Encouraged diabetic diet, daily walking   Anxiety and depression Denies SI/HI Re-started on Sertraline 100mg  QD, Buspar 7.5mg  BID Wellbutrin 300mg  - was for tobacco cessation- pt has not been on for >2 months, smoking pack/day- decline re-starting today.    FOLLOW-UP:  Return in about 6 weeks (around 04/05/2019) for Regular Follow Up, HTN, Hypercholestermia, Obesity, Diabetes.

## 2019-02-22 NOTE — Telephone Encounter (Signed)
RX for meter and supplies faxed to pharmacy.  Tiajuana Amass, CMA

## 2019-02-22 NOTE — Patient Instructions (Addendum)
Type 2 Diabetes Mellitus, Diagnosis, Adult Type 2 diabetes (type 2 diabetes mellitus) is a long-term (chronic) disease. It may be caused by one or both of these problems:  Your pancreas does not make enough of a hormone called insulin.  Your body does not react in a normal way to insulin that it makes. Insulin lets sugars (glucose) go into cells in your body. This gives you energy. If you have type 2 diabetes, sugars cannot get into cells. This causes high blood sugar (hyperglycemia). Your doctor will set treatment goals for you. Generally, you should have these blood sugar levels:  Before meals (preprandial): 80-130 mg/dL (1.6-1.0 mmol/L).  After meals (postprandial): below 180 mg/dL (10 mmol/L).  A1c (hemoglobin A1c) level: less than 7%. Follow these instructions at home: Questions to ask your doctor  You may want to ask these questions: ? Do I need to meet with a diabetes educator? ? Where can I find a support group for people with diabetes? ? What equipment will I need to care for myself at home? ? What diabetes medicines do I need? When should I take them? ? How often do I need to check my blood sugar? ? What number can I call if I have questions? ? When is my next doctor's visit? General instructions  Take over-the-counter and prescription medicines only as told by your doctor.  Keep all follow-up visits as told by your doctor. This is important. Contact a doctor if:  Your blood sugar is at or above 240 mg/dL (96.0 mmol/L) for 2 days in a row.  You have been sick for 2 days or more, and you are not getting better.  You have had a fever for 2 days or more, and you are not getting better.  You have any of these problems for more than 6 hours: ? You cannot eat or drink. ? You feel sick to your stomach (nauseous). ? You throw up (vomit). ? You have watery poop (diarrhea). Get help right away if:  Your blood sugar is lower than 54 mg/dL (3 mmol/L).  You get  confused.  You have trouble: ? Thinking clearly. ? Breathing.  You have moderate or large ketone levels in your pee (urine). Summary  Type 2 diabetes is a long-term (chronic) disease. Your pancreas may not make enough of a hormone called insulin, or your body may not react normally to insulin that it makes.  Take over-the-counter and prescription medicines only as told by your doctor.  Keep all follow-up visits as told by your doctor. This is important. This information is not intended to replace advice given to you by your health care provider. Make sure you discuss any questions you have with your health care provider. Document Revised: 03/19/2017 Document Reviewed: 02/22/2015 Elsevier Patient Education  2020 [employer].  Medications refilled, with one change- Metformin increased from 500mg  twice daily to 1000mg  twice daily. Please call you insurance and inquire which glucometer is covered-then call with that information. Please check your blood sugar each morning and several times in afternoon. Increase daily walking, remain well hydrated, follow diabetic diet. Augmentin to treat sinus infection. Referral to Cardiology placed for evaluation. Continue to social distance and wear a mask when in public. Follow-up with primary care in 6 weeks. NICE TO MEET YOU!

## 2019-02-22 NOTE — Assessment & Plan Note (Signed)
BP at goal 110/76, HR 94 She was recently re-started on Valsartan/HCTZ 160/25mg  QD

## 2019-02-22 NOTE — Assessment & Plan Note (Signed)
Pack/day- declined tobacco cessation 

## 2019-02-22 NOTE — Assessment & Plan Note (Signed)
Medications refilled, with one change- Metformin increased from 500mg  twice daily to 1000mg  twice daily. Please call you insurance and inquire which glucometer is covered-then call with that information. Please check your blood sugar each morning and several times in afternoon. Increase daily walking, remain well hydrated, follow diabetic diet. Augmentin to treat sinus infection. Referral to Cardiology placed for evaluation. Continue to social distance and wear a mask when in public. Follow-up with primary care in 6 weeks.

## 2019-02-22 NOTE — Assessment & Plan Note (Signed)
Lab Results  Component Value Date   HGBA1C 13.3 (A) 02/22/2019   HGBA1C 7.1 (H) 10/16/2010  She has not been checking BG at home Instructed to call her insurance and inquire which glucometer is covered under her insurance plan-then call us so we can send in. Metformin 500mg  BID increased to 1000mg  BID Dulaglutide re-started 1.5mg  injection once weekly BMP 02/10/2019 Creat 0.76 GFR >60 Encouraged diabetic diet, daily walking

## 2019-02-22 NOTE — Telephone Encounter (Signed)
Pt states was told to call w/ [employer] approved glucose monitor-- Per patient any (One Touch) meter---Pt needs test stripts & Meter kit (with lancets).  --Forwarding request to med asst.  --glh

## 2019-02-23 ENCOUNTER — Telehealth: Payer: Self-pay

## 2019-02-23 ENCOUNTER — Ambulatory Visit: Payer: BC Managed Care – PPO

## 2019-02-23 ENCOUNTER — Other Ambulatory Visit: Payer: Self-pay | Admitting: Adult Health

## 2019-02-23 MED ORDER — FENOFIBRATE 145 MG PO TABS: 145.0000 mg | ORAL_TABLET | Freq: Every day | ORAL | 0 refills | Status: DC

## 2019-02-23 NOTE — Telephone Encounter (Signed)
Received fax from pharmacy stating that Fenofibrate 160mg  is not covered by the pt's insurance.  They will, however, cover 135mg , 145mg , or 200mg .  Please review for possible dosage change.  , CMA

## 2019-02-23 NOTE — Telephone Encounter (Signed)
Fenofibrate 145mg  sent in Thanks! 

## 2019-02-24 ENCOUNTER — Other Ambulatory Visit: Payer: Self-pay | Admitting: Adult Health

## 2019-02-24 DIAGNOSIS — Z136 Encounter for screening for cardiovascular disorders: Secondary | ICD-10-CM

## 2019-02-24 DIAGNOSIS — I700 Atherosclerosis of aorta: Secondary | ICD-10-CM

## 2019-03-02 ENCOUNTER — Ambulatory Visit (HOSPITAL_COMMUNITY)
Admission: RE | Admit: 2019-03-02 | Payer: BC Managed Care – PPO | Source: Ambulatory Visit | Attending: Adult Health | Admitting: Adult Health

## 2019-03-06 ENCOUNTER — Encounter (HOSPITAL_COMMUNITY): Payer: Self-pay | Admitting: Internal Medicine

## 2019-03-12 ENCOUNTER — Other Ambulatory Visit: Payer: Self-pay | Admitting: Adult Health

## 2019-03-17 ENCOUNTER — Other Ambulatory Visit: Payer: Self-pay | Admitting: Adult Health

## 2019-04-05 ENCOUNTER — Other Ambulatory Visit: Payer: Self-pay

## 2019-04-05 ENCOUNTER — Encounter: Payer: Self-pay | Admitting: Family Medicine

## 2019-04-05 ENCOUNTER — Ambulatory Visit: Payer: BC Managed Care – PPO | Admitting: Family Medicine

## 2019-04-05 VITALS — BP 131/83 | HR 90 | Temp 98.0°F | Resp 12 | Ht 63.0 in | Wt 197.6 lb

## 2019-04-05 DIAGNOSIS — Z716 Tobacco abuse counseling: Secondary | ICD-10-CM

## 2019-04-05 DIAGNOSIS — Z9119 Patient's noncompliance with other medical treatment and regimen: Secondary | ICD-10-CM

## 2019-04-05 DIAGNOSIS — I10 Essential (primary) hypertension: Secondary | ICD-10-CM

## 2019-04-05 DIAGNOSIS — E1159 Type 2 diabetes mellitus with other circulatory complications: Secondary | ICD-10-CM | POA: Diagnosis not present

## 2019-04-05 DIAGNOSIS — Z720 Tobacco use: Secondary | ICD-10-CM

## 2019-04-05 DIAGNOSIS — E1165 Type 2 diabetes mellitus with hyperglycemia: Secondary | ICD-10-CM

## 2019-04-05 DIAGNOSIS — E1169 Type 2 diabetes mellitus with other specified complication: Secondary | ICD-10-CM

## 2019-04-05 DIAGNOSIS — E785 Hyperlipidemia, unspecified: Secondary | ICD-10-CM

## 2019-04-05 DIAGNOSIS — I152 Hypertension secondary to endocrine disorders: Secondary | ICD-10-CM

## 2019-04-05 DIAGNOSIS — Z91199 Patient's noncompliance with other medical treatment and regimen due to unspecified reason: Secondary | ICD-10-CM

## 2019-04-05 DIAGNOSIS — E639 Nutritional deficiency, unspecified: Secondary | ICD-10-CM

## 2019-04-05 LAB — POCT GLYCOSYLATED HEMOGLOBIN (HGB A1C): Hemoglobin A1C: 9.4 % — AB (ref 4.0–5.6)

## 2019-04-05 LAB — HM DIABETES EYE EXAM

## 2019-04-05 NOTE — Progress Notes (Signed)
Of note, this is my first OV with patient.  Patient is new to me as care was recently transferred from [facility] to me.    Impression and Recommendations:    1. Uncontrolled type 2 diabetes mellitus with hyperglycemia ([place])   2. Hypertension associated with diabetes ([place])   3. Hyperlipidemia associated with type 2 diabetes mellitus ([place])   4. Morbid obesity ([place])   5. Tobacco abuse   6. Tobacco abuse counseling   7. Medical Noncompliance     - First visit February 23, 2019. - Formerly followed by Roe [facility]om[facility] of [place].  Uncontrolled type 2 diabetes mellitus with hyperglycemia - A1c 13.3 last check, uncontrolled. - A1c down to 9.4 today, improved but still very high.  -  A1c was 7.0 in 2013. - Discussed goal A1c of under 8.0.  - Last OV, increased dose of metformin to 1000 BID from 500 BID prior, and restarted Trulicity (dulaglutide).  Patient will continue treatment plan as established.  Will continue to monitor.  - Counseled patient on pathophysiology of disease and discussed various treatment options, which always includes dietary and lifestyle modification as first line.    - Importance of low carb, heart-healthy diet discussed with patient in addition to regular aerobic exercise of 77mn 5d/week or more.   - Check FBS and 2 hours after the biggest meal of your day.  Keep log and bring in next OV for my review.     - Also told patient if you ever feel poorly, please check your blood pressure and blood sugar, as one or the other could be the cause of your symptoms.  - Pt reminded about need for yearly eye and foot exams.  Told patient to make appt.for diabetic eye exam, CMAs here will do foot exams  - We will continue to monitor and re-check A1c on April 20th.  Hypertension associated with DM - Blood pressure currently is stable, at goal. - Patient will continue current treatment regimen.  See med list.  - Counseled patient on pathophysiology of disease and  discussed various treatment options, which always includes dietary and lifestyle modification as first line.   - Lifestyle changes such as dash and heart healthy diets and engaging in a regular exercise program discussed extensively with patient.   - Told patient to purchase an upper arm blood pressure cuff for home monitoring. - Advised patient to sit calmly before measuring her pulse or blood pressure.  - Ambulatory blood pressure monitoring encouraged at least 3 times weekly.  Keep log and bring in every office visit.  Reminded patient that if they ever feel poorly in any way, to check their blood pressure and pulse.  - We will continue to monitor.  Hyperlipidemia associated with DM - Return near future to obtain FLP as advised.  - Pt will continue current treatment regimen.  See med list.  - Prudent dietary changes such as low saturated & trans fat diets for hyperlipidemia and low carb diets for hypertriglyceridemia discussed with patient.    - Encouraged patient to follow AHA guidelines for regular exercise and also engage in weight loss if BMI above 25.   - We will continue to monitor.  BMI Counseling - Body mass index is 35 kg/m Explained to patient what BMI refers to, and what it means medically.    Told patient to think about it as a "medical risk stratification measurement" and how increasing BMI is associated with increasing risk/ or worsening  state of various diseases such as hypertension, hyperlipidemia, diabetes, premature OA, depression etc.  American Heart Association guidelines for healthy diet, basically Mediterranean diet, and exercise guidelines of 30 minutes 5 days per week or more discussed in detail.  Health counseling performed.  All questions answered.  Health Counseling & Preventative Maintenance - Advised patient to continue working toward exercising to improve overall mental, physical, and emotional health.    - Patient will begin working intensively on  self-care. - Encouraged patient to begin setting small goals for herself each week. - Advised patient to write down each small goal in a notebook to keep track and continue.  - Reviewed the "spokes of the wheel" of mood and health management.  Stressed the importance of ongoing prudent habits, including regular exercise, appropriate sleep hygiene, healthful dietary habits, and prayer/meditation to relax.  - Encouraged patient to engage in daily physical activity as tolerated, especially a formal exercise routine.  Recommended that the patient eventually strive for at least 150 minutes of moderate cardiovascular activity per week according to guidelines established by the [facility].   - Healthy dietary habits encouraged, including low-carb, and high amounts of lean protein in diet.   - Patient should also consume adequate amounts of water.  Recommendations - Return end of April for in-office A1c, and bring blood pressure log and blood sugar log.  - Goal for next OV is 30 minutes of walking on the treadmill, at least 5 days per week.    Orders Placed This Encounter  Procedures  . POCT glycosylated hemoglobin (Hb A1C)    Medications Discontinued During This Encounter  Medication Reason  . amoxicillin-clavulanate (AUGMENTIN) 875-125 MG tablet Error     Gross side effects, risk and benefits, and alternatives of medications and treatment plan in general discussed with patient.  Patient is aware that all medications have potential side effects and we are unable to predict every side effect or drug-drug interaction that may occur.   Patient will call with any questions prior to using medication if they have concerns.    Expresses verbal understanding and consents to current therapy and treatment regimen.  No barriers to understanding were identified.  Red flag symptoms and signs discussed in detail.  Patient expressed understanding regarding what to do in case of emergency\urgent symptoms  Please  see AVS handed out to patient at the end of our visit for further patient instructions/ counseling done pertaining to today's office visit.   Return for f/up end April for in-office A1c, and bring blood pressure log and blood sugar log.     Note:  This note was prepared with assistance of Dragon voice recognition software. Occasional wrong-word or sound-a-like substitutions may have occurred due to the inherent limitations of voice recognition software.   The [place] was signed into law in 2016 which includes the topic of electronic health records.  This provides immediate access to information in MyChart.  This includes consultation notes, operative notes, office notes, lab results and pathology reports.  If you have any questions about what you read please let us know at your next visit or call us at the office.  We are right here with you.   This case required medical decision making of at least moderate complexity.  This document serves as a record of services personally performed by Mellody Dance, DO. It was created on her behalf by Toni Amend, a trained medical scribe. The creation of this record is based on the scribe's  personal observations and the provider's statements to them.   This case required medical decision making of at least moderate complexity. The above documentation from Toni Amend, medical scribe, has been reviewed by Marjory Sneddon, D.O.   -------------------------------------------------------------------------------------------------------------------------------------------------------------------------------------------------------------------------------------------- Subjective:     [name], am serving as scribe for Dr.Etoile Looman.   HPI: [name] is a 53 y.o. female who presents to [place] [facility] today for issues as discussed below.   She checks her oxygen and heart rate at home  using a pulse oximeter.  Checks it in the mornings when she's drinking her coffee and taking her meds.   - Lifestyle Habits Says she tries to eat better now.  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  Notes her preferred exercise is getting on the treadmill and listening to music; "I just haven't been doing it."  She has one daughter and three grandchildren and states "they're on me" to help change her life.  Notes "I just want to feel good enough to do the things I want to do, and I don't have it after work."  Says "the energy is what I want more than anything."   - Current Every Day Smoker States she wants to quit, but has not been able to.   HPI:   Diabetes Mellitus:  Home glucose readings: around January 21, was running 400-something in the morning, "before starting the medicine."  Now 147, 159, 129, 126, 126, 147, 128, 112, 163, 148, 135.  Notes "I was excited" about her improvement in blood sugars.   - Patient reports good compliance with therapy plan: medication and/or lifestyle modification.  In the past, she was on exactly the same medication plan as she resumed now; Trulicity and metformin 1000 mg BID.  Notes "it would've been perfect if I had done it myself."  She doesn't remember her A1c's a year ago.  Notes "it never got settled, because I wasn't going back to have the tests done."  - Her denies acute concerns or problems related to treatment plan  - She denies new concerns.  Denies polyuria/polydipsia, hypo/ hyperglycemia symptoms.  Denies new onset of: chest pain, exercise intolerance, shortness of breath, dizziness, visual changes, headache, lower extremity  swelling or claudication.   Last A1C in the office was:  Lab Results  Component Value Date   HGBA1C 9.4 (A) 04/05/2019   HGBA1C 13.3 (A) 02/22/2019   HGBA1C 7.1 (H) 10/16/2010   Lab Results  Component Value Date   MICROALBUR 80 02/22/2019   LDLCALC 81 10/17/2010   CREATININE 0.76 02/10/2019   BP Readings from Last 3 Encounters:  04/05/19 131/83  02/22/19 110/76  02/10/19 (!) 146/106   Wt Readings from Last 3 Encounters:  04/05/19 197 lb 9.6 oz (89.6 kg)  02/22/19 197 lb 11.2 oz (89.7 kg)  07/02/17 216 lb (98 kg)   HPI:  Hypertension:  Has not been checking her blood pressures at home.  - She denies new onset of: chest pain, exercise intolerance, shortness of breath, dizziness, visual changes, headache, lower extremity swelling or claudication.   Last 3 blood pressure readings in  our office are as follows: BP Readings from Last 3 Encounters:  04/05/19 131/83  02/22/19 110/76  02/10/19 (!) 146/106   Filed Weights   04/05/19 1555  Weight: 197 lb 9.6 oz (89.6 kg)    HPI:  Hyperlipidemia:  54 y.o. female here for cholesterol follow-up.   - Patient reports good compliance with treatment plan of:  medication and/ or lifestyle management.    - Patient denies any acute concerns or problems with management plan   - She denies new onset of: myalgias, arthralgias, increased fatigue more than normal, chest pains, exercise intolerance, shortness of breath, dizziness, visual changes, headache, lower extremity swelling or claudication.   Most recent cholesterol panel was:  Lab Results  Component Value Date   CHOL 173 10/17/2010   HDL 30 (L) 10/17/2010   LDLCALC 81 10/17/2010   TRIG 308 (H) 10/17/2010   CHOLHDL 5.8 10/17/2010   Hepatic Function Latest Ref Rng & Units 10/15/2010  Total Protein 6.0 - 8.3 g/dL 6.6  Albumin 3.5 - 5.2 g/dL 3.2(L)  AST 0 - 37 U/L 61(H)  ALT 0 - 35 U/L 71(H)  Alk Phosphatase 39 - 117 U/L 81  Total Bilirubin 0.3 - 1.2 mg/dL 0.4      Wt  Readings from Last 3 Encounters:  04/05/19 197 lb 9.6 oz (89.6 kg)  02/22/19 197 lb 11.2 oz (89.7 kg)  07/02/17 216 lb (98 kg)   BP Readings from Last 3 Encounters:  04/05/19 131/83  02/22/19 110/76  02/10/19 (!) 146/106   Pulse Readings from Last 3 Encounters:  04/05/19 90  02/22/19 94  02/10/19 89   BMI Readings from Last 3 Encounters:  04/05/19 35.00 kg/m  02/22/19 34.47 kg/m  07/02/17 38.26 kg/m     Patient Care Team    Relationship Specialty Notifications Start End  Esaw Grandchild, NP PCP - General Family Medicine  02/22/19      Patient Active Problem List   Diagnosis Date Noted  . Uncontrolled diabetes mellitus ([place]) 07/03/2017  . Hypertension associated with diabetes ([place]) 07/03/2017  . Hyperlipidemia associated with type 2 diabetes mellitus ([place]) 08/20/2016  . Tobacco abuse counseling 04/09/2019  . Healthcare maintenance 02/22/2019  . Depressive disorder 09/08/2016  . Chronic obstructive lung disease ([place]) 09/08/2016  . Hyperlipidemia 09/08/2016  . Morbid obesity ([place]) 08/20/2016  . History of TIA (transient ischemic attack) 05/05/2011  . Hypothyroid 08/08/2010  . Tobacco abuse 07/10/2010    Past Medical history, Surgical history, Family history, Social history, Allergies and Medications have been entered into the medical record, reviewed and changed as needed.    Current Meds  Medication Sig  . albuterol (VENTOLIN HFA) 108 (90 Base) MCG/ACT inhaler Inhale 2 puffs into the lungs every 4 (four) hours as needed for wheezing or shortness of breath.  . busPIRone (BUSPAR) 7.5 MG tablet Take 1 tablet (7.5 mg total) by mouth 2 (two) times daily.  . Dulaglutide 1.5 MG/0.5ML SOPN Inject 0.5 mLs into the skin once a week.  . fenofibrate (TRICOR) 145 MG tablet Take 1 tablet (145 mg total) by mouth daily.  Marland Kitchen levothyroxine (SYNTHROID, LEVOTHROID) 200 MCG tablet Take 200 mcg by mouth daily.    . metFORMIN (GLUCOPHAGE) 1000 MG tablet Take 1 tablet (1,000 mg total) by  mouth 2 (two) times daily with a meal.  . rosuvastatin (CRESTOR) 40 MG tablet Take 1 tablet (40 mg total) by mouth daily.  . sertraline (ZOLOFT) 100 MG tablet Take 1 tablet (100 mg total) by mouth  daily.  . SYMBICORT 160-4.5 MCG/ACT inhaler Inhale 2 puffs into the lungs 2 (two) times daily.  . valsartan-hydrochlorothiazide (DIOVAN-HCT) 160-25 MG tablet Take 1 tablet by mouth daily.    Allergies:  Allergies  Allergen Reactions  . Compazine  [Prochlorperazine] Anaphylaxis  . Thiethylperazine Anaphylaxis  . Vancomycin Rash and Itching    Patient developed localized itching, erythema, swelling.  Onset within 2 min of starting vanc IV.  Appears to be a true vanc allergy and not red-mans syndrome. Patient developed localized itching, erythema, swelling.  Onset within 2 min of starting vanc IV.  Appears to be a true vanc allergy and not red-mans syndrome.   . Compazine   . Lasix [Furosemide]     BREAKS OUT IN HIVES     Review of Systems:  A fourteen system review of systems was performed and found to be positive as per HPI.   Objective:   Blood pressure 131/83, pulse 90, temperature 98 F (36.7 C), temperature source Oral, resp. rate 12, height '5\' 3"'  (1.6 m), weight 197 lb 9.6 oz (89.6 kg), SpO2 96 %. Body mass index is 35 kg/m. General:  Well Developed, well nourished, appropriate for stated age.  Neuro:  Alert and oriented,  extra-ocular muscles intact  HEENT:  Normocephalic, atraumatic, neck supple, no carotid bruits appreciated  Skin:  no gross rash, warm, pink. Cardiac:  RRR, S1 S2 Respiratory:  ECTA B/L and A/P, Not using accessory muscles, speaking in full sentences- unlabored. Vascular:  Ext warm, no cyanosis apprec.; cap RF less 2 sec. Psych:  No HI/SI, judgement and insight good, Euthymic mood. Full Affect.

## 2019-04-05 NOTE — Patient Instructions (Addendum)
We recommend obtaining OMRON Series 3 blood pressure cuff for the upper arm. Check Bp and bring in Logs of BS and BP  Look into obtaining the book Atomic Habits, to help with lifestyle changes.   So your goal by the next time I see you towards the end of April is to be walking on the treadmill for 30 minutes or more at least 5 days a week      Type 2 diabetes  Researchers don't fully understand why some people develop prediabetes and type 2 diabetes and others don't.  It's clear that certain factors increase the risk, however, including:  Weight. The more fatty tissue you have, the more resistant your cells become to insulin.  Inactivity. The less active you are, the greater your risk. Physical activity helps you control your weight, uses up glucose as energy and makes your cells more sensitive to insulin.  Family history. Your risk increases if a parent or sibling has type 2 diabetes.  Race. Although it's unclear why, people of certain races -- including blacks, Hispanics, American Indians and Asian-Americans -- are at higher risk.  Age. Your risk increases as you get older. This may be because you tend to exercise less, lose muscle mass and gain weight as you age. But type 2 diabetes is also increasing dramatically among children, adolescents and younger adults.  Gestational diabetes. If you developed gestational diabetes when you were pregnant, your risk of developing prediabetes and type 2 diabetes later increases. If you gave birth to a baby weighing more than 9 pounds (4 kilograms), you're also at risk of type 2 diabetes.  Polycystic ovary syndrome. For women, having polycystic ovary syndrome -- a common condition characterized by irregular menstrual periods, excess hair growth and obesity -- increases the risk of diabetes.  High blood pressure. Having blood pressure over 140/90 millimeters of mercury (mm Hg) is linked to an increased risk of type 2 diabetes.  Abnormal cholesterol and  triglyceride levels. If you have low levels of high-density lipoprotein (HDL), or "good," cholesterol, your risk of type 2 diabetes is higher. Triglycerides are another type of fat carried in the blood. People with high levels of triglycerides have an increased risk of type 2 diabetes. Your doctor can let you know what your cholesterol and triglyceride levels are.  A good guide to good carbs: The glycemic index ---If you have diabetes, or at risk for diabetes, you know all too well that when you eat carbohydrates, your blood sugar goes up. The total amount of carbs you consume at a meal or in a snack mostly determines what your blood sugar will do. But the food itself also plays a role. A serving of white rice has almost the same effect as eating pure table sugar -- a quick, high spike in blood sugar. A serving of lentils has a slower, smaller effect.  ---Picking good sources of carbs can help you control your blood sugar and your weight. Even if you don't have diabetes, eating healthier carbohydrate-rich foods can help ward off a host of chronic conditions, from heart disease to various cancers to, well, diabetes.  ---One way to choose foods is with the glycemic index (GI). This tool measures how much a food boosts blood sugar.  The glycemic index rates the effect of a specific amount of a food on blood sugar compared with the same amount of pure glucose. A food with a glycemic index of 28 boosts blood sugar only 28% as much as pure  glucose. One with a GI of 95 acts like pure glucose.    High glycemic foods result in a quick spike in insulin and blood sugar (also known as blood glucose).  Low glycemic foods have a slower, smaller effect- these are healthier for you.   Using the glycemic index Using the glycemic index is easy: choose foods in the low GI category instead of those in the high GI category (see below), and go easy on those in between. Low glycemic index (GI of 55 or less): Most fruits and  vegetables, beans, minimally processed grains, pasta, low-fat dairy foods, and nuts.  Moderate glycemic index (GI 56 to 69): White and sweet potatoes, corn, white rice, couscous, breakfast cereals such as Cream of Wheat and Mini Wheats.  High glycemic index (GI of 70 or higher): White bread, rice cakes, most crackers, bagels, cakes, doughnuts, croissants, most packaged breakfast cereals. You can see the values for 100 commons foods and get links to more at www.health.RecordDebt.hu.  Swaps for lowering glycemic index  Instead of this high-glycemic index food Eat this lower-glycemic index food  White rice Brown rice or converted rice  Instant oatmeal Steel-cut oats  Cornflakes Bran flakes  Baked potato Pasta, bulgur  White bread Whole-grain bread  Corn Peas or leafy greens       Prediabetes Eating Plan  Prediabetes--also called impaired glucose tolerance or impaired fasting glucose--is a condition that causes blood sugar (blood glucose) levels to be higher than normal. Following a healthy diet can help to keep prediabetes under control. It can also help to lower the risk of type 2 diabetes and heart disease, which are increased in people who have prediabetes. Along with regular exercise, a healthy diet:  Promotes weight loss.  Helps to control blood sugar levels.  Helps to improve the way that the body uses insulin.   WHAT DO I NEED TO KNOW ABOUT THIS EATING PLAN?   Use the glycemic index (GI) to plan your meals. The index tells you how quickly a food will raise your blood sugar. Choose low-GI foods. These foods take a longer time to raise blood sugar.  Pay close attention to the amount of carbohydrates in the food that you eat. Carbohydrates increase blood sugar levels.  Keep track of how many calories you take in. Eating the right amount of calories will help you to achieve a healthy weight. Losing about 7 percent of your starting weight can help to prevent type 2  diabetes.  You may want to follow a Mediterranean diet. This diet includes a lot of vegetables, lean meats or fish, whole grains, fruits, and healthy oils and fats.   WHAT FOODS CAN I EAT?  Grains Whole grains, such as whole-wheat or whole-grain breads, crackers, cereals, and pasta. Unsweetened oatmeal. Bulgur. Barley. Quinoa. Brown rice. Corn or whole-wheat flour tortillas or taco shells. Vegetables Lettuce. Spinach. Peas. Beets. Cauliflower. Cabbage. Broccoli. Carrots. Tomatoes. Squash. Eggplant. Herbs. Peppers. Onions. Cucumbers. Brussels sprouts. Fruits Berries. Bananas. Apples. Oranges. Grapes. Papaya. Mango. Pomegranate. Kiwi. Grapefruit. Cherries. Meats and Other Protein Sources Seafood. Lean meats, such as chicken and [place] or lean cuts of pork and beef. Tofu. Eggs. Nuts. Beans. Dairy Low-fat or fat-free dairy products, such as yogurt, cottage cheese, and cheese. Beverages Water. Tea. Coffee. Sugar-free or diet soda. Seltzer water. Milk. Milk alternatives, such as soy or almond milk. Condiments Mustard. Relish. Low-fat, low-sugar ketchup. Low-fat, low-sugar barbecue sauce. Low-fat or fat-free mayonnaise. Sweets and Desserts Sugar-free or low-fat pudding. Sugar-free or low-fat  ice cream and other frozen treats. Fats and Oils Avocado. Walnuts. Olive oil. The items listed above may not be a complete list of recommended foods or beverages. Contact your dietitian for more options.    WHAT FOODS ARE NOT RECOMMENDED?  Grains Refined white flour and flour products, such as bread, pasta, snack foods, and cereals. Beverages Sweetened drinks, such as sweet iced tea and soda. Sweets and Desserts Baked goods, such as cake, cupcakes, pastries, cookies, and cheesecake. The items listed above may not be a complete list of foods and beverages to avoid. Contact your dietitian for more information.   This information is not intended to replace advice given to you by your health care  provider. Make sure you discuss any questions you have with your health care provider.   Document Released: 06/05/2014 Document Reviewed: 06/05/2014 Elsevier Interactive Patient Education 2016 [employer].     Behavior Modification Ideas for [employer]  Weight management involves adopting a healthy lifestyle that includes a knowledge of nutrition and exercise, a positive attitude and the right kind of motivation. Internal motives such as better health, increased energy, self-esteem and personal control increase your chances of lifelong weight management success.  Remember to have realistic goals and think long-term success. Believe in yourself and you can do it. The following information will give you ideas to help you meet your goals.  Control Your Home Environment  Eat only while sitting down at the kitchen or dining room table. Do not eat while watching television, reading, cooking, talking on the phone, standing at the refrigerator or working on the computer. Keep tempting foods out of the house -- don't buy them. Keep tempting foods out of sight. Have low-calorie foods ready to eat. Unless you are preparing a meal, stay out of the kitchen. Have healthy snacks at your disposal, such as small pieces of fruit, vegetables, canned fruit, pretzels, low-fat string cheese and nonfat cottage cheese.  Control Your Work Environment  Do not eat at [employer] or keep tempting snacks at your desk. If you get hungry between meals, plan healthy snacks and bring them with you to work. During your breaks, go for a walk instead of eating. If you work around food, plan in advance the one item you will eat at mealtime. Make it inconvenient to nibble on food by chewing gum, sugarless candy or drinking water or another low-calorie beverage. Do not work through meals. Skipping meals slows down metabolism and may result in overeating at the next meal. If food is available for special occasions,  either pick the healthiest item, nibble on low-fat snacks brought from home, don't have anything offered, choose one option and have a small amount, or have only a beverage.  Control Your Mealtime Environment  Serve your plate of food at the stove or kitchen counter. Do not put the serving dishes on the table. If you do put dishes on the table, remove them immediately when finished eating. Fill half of your plate with vegetables, a quarter with lean protein and a quarter with starch. Use smaller plates, bowls and glasses. A smaller portion will look large when it is in a little dish. Politely refuse second helpings. When fixing your plate, limit portions of food to one scoop/serving or less.   Daily Food Management  Replace eating with another activity that you will not associate with food. Wait 20 minutes before eating something you are craving. Drink a large glass of water or diet soda before eating. Always  have a big glass or bottle of water to drink throughout the day. Avoid high-calorie add-ons such as cream with your coffee, butter, mayonnaise and salad dressings.  Shopping: Do not shop when hungry or tired. Shop from a list and avoid buying anything that is not on your list. If you must have tempting foods, buy individual-sized packages and try to find a lower-calorie alternative. Don't taste test in the store. Read food labels. Compare products to help you make the healthiest choices.  Preparation: Chew a piece of gum while cooking meals. Use a quarter teaspoon if you taste test your food. Try to only fix what you are going to eat, leaving yourself no chance for seconds. If you have prepared more food than you need, portion it into individual containers and freeze or refrigerate immediately. Don't snack while cooking meals.  Eating: Eat slowly. Remember it takes about 20 minutes for your stomach to send a message to your brain that it is full. Don't let fake hunger make you  think you need more. The ideal way to eat is to take a bite, put your utensil down, take a sip of water, cut your next bite, take a bit, put your utensil down and so on. Do not cut your food all at one time. Cut only as needed. Take small bites and chew your food well. Stop eating for a minute or two at least once during a meal or snack. Take breaks to reflect and have conversation.  Cleanup and Leftovers: Label leftovers for a specific meal or snack. Freeze or refrigerate individual portions of leftovers. Do not clean up if you are still hungry.  Eating Out and Social Eating  Do not arrive hungry. Eat something light before the meal. Try to fill up on low-calorie foods, such as vegetables and fruit, and eat smaller portions of the high-calorie foods. Eat foods that you like, but choose small portions. If you want seconds, wait at least 20 minutes after you have eaten to see if you are actually hungry or if your eyes are bigger than your stomach. Limit alcoholic beverages. Try a soda water with a twist of lime. Do not skip other meals in the day to save room for the special event.  At Restaurants: Order  la carte rather than buffet style. Order some vegetables or a salad for an appetizer instead of eating bread. If you order a high-calorie dish, share it with someone. Try an after-dinner mint with your coffee. If you do have dessert, share it with two or more people. Don't overeat because you do not want to waste food. Ask for a doggie bag to take extra food home. Tell the server to put half of your entree in a to go bag before the meal is served to you. Ask for salad dressing, gravy or high-fat sauces on the side. Dip the tip of your fork in the dressing before each bite. If bread is served, ask for only one piece. Try it plain without butter or oil. At [employer] restaurants where oil and vinegar is served with bread, use only a small amount of oil and a lot of vinegar for dipping.  At  a Friend's House: Offer to bring a dish, appetizer or dessert that is low in calories. Serve yourself small portions or tell the host that you only want a small amount. Stand or sit away from the snack table. Stay away from the kitchen or stay busy if you are near the food. Limit  your alcohol intake.  At [employer] and Cafeterias: Cover most of your plate with lettuce and/or vegetables. Use a salad plate instead of a dinner plate. After eating, clear away your dishes before having coffee or tea.  Entertaining at Home: Explore low-fat, low-cholesterol cookbooks. Use single-serving foods like chicken breasts or hamburger patties. Prepare low-calorie appetizers and desserts.   Holidays: Keep tempting foods out of sight. Decorate the house without using food. Have low-calorie beverages and foods on hand for guests. Allow yourself one planned treat a day. Don't skip meals to save up for the holiday feast. Eat regular, planned meals.   Exercise Well  Make exercise a priority and a planned activity in the day. If possible, walk the entire or part of the distance to work. Get an exercise buddy. Go for a walk with a colleague during one of your breaks, go to the gym, run or take a walk with a friend, walk in the mall with a shopping companion. Park at the end of the parking lot and walk to the store or office entrance. Always take the stairs all of the way or at least part of the way to your floor. If you have a desk job, walk around the office frequently. Do leg lifts while sitting at your desk. Do something outside on the weekends like going for a hike or a bike ride.   Have a Healthy Attitude  Make health your weight management priority. Be realistic. Have a goal to achieve a healthier you, not necessarily the lowest weight or ideal weight based on calculations or tables. Focus on a healthy eating style, not on dieting. Dieting usually lasts for a short amount of time and rarely  produces long-term success. Think long term. You are developing new healthy behaviors to follow next month, in a year and in a decade.    This information is for educational purposes only and is not intended to replace the advice of your doctor or health care provider. We encourage you to discuss with your doctor any questions or concerns you may have.        Guidelines for Losing Weight   We want weight loss that will last so you should lose 1-2 pounds a week.  THAT IS IT! Please pick THREE things a month to change. Once it is a habit check off the item. Then pick another three items off the list to become habits.  If you are already doing a habit on the list GREAT!  Cross that item off!  Don't drink your calories. Ie, alcohol, soda, fruit juice, and sweet tea.   Drink more water. Drink a glass when you feel hungry or before each meal.   Eat breakfast - Complex carb and protein (likeDannon light and fit yogurt, oatmeal, fruit, eggs, [place] bacon).  Measure your cereal.  Eat no more than one cup a day. (ie Kashi)  Eat an apple a day.  Add a vegetable a day.  Try a new vegetable a month.  Use Pam! Stop using oil or butter to cook.  Don't finish your plate or use smaller plates.  Share your dessert.  Eat sugar free Jello for dessert or frozen grapes.  Don't eat 2-3 hours before bed.  Switch to whole wheat bread, pasta, and brown rice.  Make healthier choices when you eat out. No fries!  Pick baked chicken, NOT fried.  Don't forget to SLOW DOWN when you eat. It is not going anywhere.   Take the stairs.  Park far away in the parking lot  Lift soup cans (or weights) for 10 minutes while watching TV.  Walk at work for 10 minutes during break.  Walk outside 1 time a week with your friend, kids, dog, or significant other.  Start a walking group at church.  Walk the mall as much as you can tolerate.   Keep a food diary.  Weigh yourself daily.  Walk for 15  minutes 3 days per week.  Cook at home more often and eat out less. If life happens and you go back to old habits, it is okay.  Just start over. You can do it!  If you experience chest pain, get short of breath, or tired during the exercise, please stop immediately and inform your doctor.    Before you even begin to attack a weight-loss plan, it pays to remember this: You are not fat. You have fat. Losing weight isn't about blame or shame; it's simply another achievement to accomplish. Dieting is like any other skill--you have to buckle down and work at it. As long as you act in a smart, reasonable way, you'll ultimately get where you want to be. Here are some weight loss pearls for you.   1. It's Not a Diet. It's a Lifestyle Thinking of a diet as something you're on and suffering through only for the short term doesn't work. To shed weight and keep it off, you need to make permanent changes to the way you eat. It's OK to indulge occasionally, of course, but if you cut calories temporarily and then revert to your old way of eating, you'll gain back the weight quicker than you can say yo-yo. Use it to lose it. Research shows that one of the best predictors of long-term weight loss is how many pounds you drop in the first month. For that reason, nutritionists often suggest being stricter for the first two weeks of your new eating strategy to build momentum. Cut out added sugar and alcohol and avoid unrefined carbs. After that, figure out how you can reincorporate them in a way that's healthy and maintainable.  2. There's a Right Way to Exercise Working out burns calories and fat and boosts your metabolism by building muscle. But those trying to lose weight are notorious for overestimating the number of calories they burn and underestimating the amount they take in. Unfortunately, your system is biologically programmed to hold on to extra pounds and that means when you start exercising, your body senses  the deficit and ramps up its hunger signals. If you're not diligent, you'll eat everything you burn and then some. Use it, to lose it. Cardio gets all the exercise glory, but strength and interval training are the real heroes. They help you build lean muscle, which in turn increases your metabolism and calorie-burning ability 3. Don't Overreact to Mild Hunger Some people have a hard time losing weight because of hunger anxiety. To them, being hungry is bad--something to be avoided at all costs--so they carry snacks with them and eat when they don't need to. Others eat because they're stressed out or bored. While you never want to get to the point of being ravenous (that's when bingeing is likely to happen), a hunger pang, a craving, or the fact that it's 3:00 p.m. should not send you racing for the vending machine or obsessing about the energy bar in your purse. Ideally, you should put off eating until your stomach is growling and it's difficult to concentrate.  Use it to lose it. When you feel the urge to eat, use the HALT method. Ask yourself, Am I really hungry? Or am I angry or anxious, lonely or bored, or tired? If you're still not certain, try the apple test. If you're truly hungry, an apple should seem delicious; if it doesn't, something else is going on. Or you can try drinking water and making yourself busy, if you are still hungry try a healthy snack.  4. Not All Calories Are Created Equal The mechanics of weight loss are pretty simple: Take in fewer calories than you use for energy. But the kind of food you eat makes all the difference. Processed food that's high in saturated fat and refined starch or sugar can cause inflammation that disrupts the hormone signals that tell your brain you're full. The result: You eat a lot more.  Use it to lose it. Clean up your diet. Swap in whole, unprocessed foods, including vegetables, lean protein, and healthy fats that will fill you up and give you the biggest  nutritional bang for your calorie buck. In a few weeks, as your brain starts receiving regular hunger and fullness signals once again, you'll notice that you feel less hungry overall and naturally start cutting back on the amount you eat.  5. Protein, Produce, and Plant-Based Fats Are Your Weight-Loss Trinity Here's why eating the three Ps regularly will help you drop pounds. Protein fills you up. You need it to build lean muscle, which keeps your metabolism humming so that you can torch more fat. People in a weight-loss program who ate double the recommended daily allowance for protein (about 110 grams for a 150-pound woman) lost 70 percent of their weight from fat, while people who ate the RDA lost only about 40 percent, one study found. Produce is packed with filling fiber. "It's very difficult to consume too many calories if you're eating a lot of vegetables. Example: Three cups of broccoli is a lot of food, yet only 93 calories. (Fruit is another story. It can be easy to overeat and can contain a lot of calories from sugar, so be sure to monitor your intake.) Plant-based fats like olive oil and those in avocados and nuts are healthy and extra satiating.  Use it to lose it. Aim to incorporate each of the three Ps into every meal and snack. People who eat protein throughout the day are able to keep weight off, according to a study in the American Journal of Clinical Nutrition. In addition to meat, poultry and seafood, good sources are beans, lentils, eggs, tofu, and yogurt. As for fat, keep portion sizes in check by measuring out salad dressing, oil, and nut butters (shoot for one to two tablespoons). Finally, eat veggies or a little fruit at every meal. People who did that consumed 308 fewer calories but didn't feel any hungrier than when they didn't eat more produce.  7. How You Eat Is As Important As What You Eat In order for your brain to register that you're full, you need to focus on what you're  eating. Sit down whenever you eat, preferably at a table. Turn off the TV or computer, put down your phone, and look at your food. Smell it. Chew slowly, and don't put another bite on your fork until you swallow. When women ate lunch this attentively, they consumed 30 percent less when snacking later than those who listened to an audiobook at lunchtime, according to a study in the [employer] of  Nutrition. 8. Weighing Yourself Really Works The scale provides the best evidence about whether your efforts are paying off. Seeing the numbers tick up or down or stagnate is motivation to keep going--or to rethink your approach. A 2015 study at [facility] found that daily weigh-ins helped people lose more weight, keep it off, and maintain that loss, even after two years. Use it to lose it. Step on the scale at the same time every day for the best results. If your weight shoots up several pounds from one weigh-in to the next, don't freak out. Eating a lot of salt the night before or having your period is the likely culprit. The number should return to normal in a day or two. It's a steady climb that you need to do something about. 9. Too Much Stress and Too Little Sleep Are Your Enemies When you're tired and frazzled, your body cranks up the production of cortisol, the stress hormone that can cause carb cravings. Not getting enough sleep also boosts your levels of ghrelin, a hormone associated with hunger, while suppressing leptin, a hormone that signals fullness and satiety. People on a diet who slept only five and a half hours a night for two weeks lost 55 percent less fat and were hungrier than those who slept eight and a half hours, according to a study in the [place] [employer] Journal. Use it to lose it. Prioritize sleep, aiming for seven hours or more a night, which research shows helps lower stress. And make sure you're getting quality zzz's. If a snoring spouse or a fidgety cat wakes you  up frequently throughout the night, you may end up getting the equivalent of just four hours of sleep, according to a study from [facility]. Keep pets out of the bedroom, and use a white-noise app to drown out snoring. 10. You Will Hit a plateau--And You Can Bust Through It As you slim down, your body releases much less leptin, the fullness hormone.  If you're not strength training, start right now. Building muscle can raise your metabolism to help you overcome a plateau. To keep your body challenged and burning calories, incorporate new moves and more intense intervals into your workouts or add another sweat session to your weekly routine. Alternatively, cut an extra 100 calories or so a day from your diet. Now that you've lost weight, your body simply doesn't need as much fuel.    Since food equals calories, in order to lose weight you must either eat fewer calories, exercise more to burn off calories with activity, or both. Food that is not used to fuel the body is stored as fat. A major component of losing weight is to make smarter food choices. Here's how:  1)   Limit non-nutritious foods, such as: Sugar, honey, syrups and candy Pastries, donuts, pies, cakes and cookies Soft drinks, sweetened juices and alcoholic beverages  2)  Cut down on high-fat foods by: - Choosing poultry, fish or lean red meat - Choosing low-fat cooking methods, such as baking, broiling, steaming, grilling and boiling - Using low-fat or non-fat dairy products - Using vinaigrette, herbs, lemon or fat-free salad dressings - Avoiding fatty meats, such as bacon, sausage, franks, ribs and luncheon meats - Avoiding high-fat snacks like nuts, chips and chocolate - Avoiding fried foods - Using less butter, margarine, oil and mayonnaise - Avoiding high-fat gravies, cream sauces and cream-based soups  3) Eat a variety of foods, including: - Fruit and vegetables that are  raw, steamed or baked - Whole grains,  breads, cereal, rice and pasta - Dairy products, such as low-fat or non-fat milk or yogurt, low-fat cottage cheese and low-fat cheese - Protein-rich foods like chicken, [place], fish, lean meat and legumes, or beans  4) Change your eating habits by: - Eat three balanced meals a day to help control your hunger - Watch portion sizes and eat small servings of a variety of foods - Choose low-calorie snacks - Eat only when you are hungry and stop when you are satisfied - Eat slowly and try not to perform other tasks while eating - Find other activities to distract you from food, such as walking, taking up a hobby or being involved in the community - Include regular exercise in your daily routine ( minimum of 20 min of moderate-intensity exercise at least 5 days/week)  - Find a support group, if necessary, for emotional support in your weight loss journey           Easy ways to cut 100 calories   1. Eat your eggs with hot sauce OR salsa instead of cheese.  Eggs are great for breakfast, but many people consider eggs and cheese to be BFFs. Instead of cheese--1 oz. of cheddar has 114 calories--top your eggs with hot sauce, which contains no calories and helps with satiety and metabolism. Salsa is also a great option!!  2. Top your toast, waffles or pancakes with fresh berries instead of jelly or syrup. Half a cup of berries--fresh, frozen or thawed--has about 40 calories, compared with 2 tbsp. of maple syrup or jelly, which both have about 100 calories. The berries will also give you a good punch of fiber, which helps keep you full and satisfied and won't spike blood sugar quickly like the jelly or syrup. 3. Swap the non-fat latte for black coffee with a splash of half-and-half. Contrary to its name, that non-fat latte has 130 calories and a startling 19g of carbohydrates per 16 oz. serving. Replacing that 'light' drinkable dessert with a black coffee with a splash of half-and-half saves you more  than 100 calories per 16 oz. serving. 4. Sprinkle salads with freeze-dried raspberries instead of dried cranberries. If you want a sweet addition to your nutritious salad, stay away from dried cranberries. They have a whopping 130 calories per  cup and 30g carbohydrates. Instead, sprinkle freeze-dried raspberries guilt-free and save more than 100 calories per  cup serving, adding 3g of belly-filling fiber. 5. Go for mustard in place of mayo on your sandwich. Mustard can add really nice flavor to any sandwich, and there are tons of varieties, from spicy to honey. A serving of mayo is 95 calories, versus 10 calories in a serving of mustard.  Or try an avocado mayo spread: You can find the recipe few click this link: https://www.californiaavocado.com/recipes/recipe-container/[place]-avocado-mayo 6. Choose a DIY salad dressing instead of the store-bought kind. Mix Dijon or whole grain mustard with low-fat Kefir or red wine vinegar and garlic. 7. Use hummus as a spread instead of a dip. Use hummus as a spread on a high-fiber cracker or tortilla with a sandwich and save on calories without sacrificing taste. 8. Pick just one salad "accessory." Salad isn't automatically a calorie winner. It's easy to over-accessorize with toppings. Instead of topping your salad with nuts, avocado and cranberries (all three will clock in at 313 calories), just pick one. The next day, choose a different accessory, which will also keep your salad interesting. You don't wear all  your jewelry every day, right? 9. Ditch the white pasta in favor of spaghetti squash. One cup of cooked spaghetti squash has about 40 calories, compared with traditional spaghetti, which comes with more than 200. Spaghetti squash is also nutrient-dense. It's a good source of fiber and Vitamins A and C, and it can be eaten just like you would eat pasta--with a great tomato sauce and [place] meatballs or with pesto, tofu and spinach, for example. 10.  Dress up your chili, soups and stews with non-fat [place] yogurt instead of sour cream. Just a 'dollop' of sour cream can set you back 115 calories and a whopping 12g of fat--seven of which are of the artery-clogging variety. Added bonus: [place] yogurt is packed with muscle-building protein, calcium and B Vitamins. 11. Mash cauliflower instead of mashed potatoes. One cup of traditional mashed potatoes--in all their creamy goodness--has more than 200 calories, compared to mashed cauliflower, which you can typically eat for less than 100 calories per 1 cup serving. Cauliflower is a great source of the antioxidant indole-3-carbinol (I3C), which may help reduce the risk of some cancers, like breast cancer. 12. Ditch the ice cream sundae in favor of a [place] yogurt parfait. Instead of a cup of ice cream or fro-yo for dessert, try 1 cup of nonfat Greek yogurt topped with fresh berries and a sprinkle of cacao nibs. Both toppings are packed with antioxidants, which can help reduce cellular inflammation and oxidative damage. And the comparison is a no-brainer: One cup of ice cream has about 275 calories; one cup of frozen yogurt has about 230; and a cup of Greek yogurt has just 130, plus twice the protein, so you're less likely to return to the freezer for a second helping. 13. Put olive oil in a spray container instead of using it directly from the bottle. Each tablespoon of olive oil is 120 calories and 15g of fat. Use a mister instead of pouring it straight into the pan or onto a salad. This allows for portion control and will save you more than 100 calories. 14. When baking, substitute canned pumpkin for butter or oil. Canned pumpkin--not pumpkin pie mix--is loaded with Vitamin A, which is important for skin and eye health, as well as immunity. And the comparisons are pretty crazy:  cup of canned pumpkin has about 40 calories, compared to butter or oil, which has more than 800 calories. Yes, 800 calories.  Applesauce and mashed banana can also serve as good substitutions for butter or oil, usually in a 1:1 ratio. 15. Top casseroles with high-fiber cereal instead of breadcrumbs. Breadcrumbs are typically made with white bread, while breakfast cereals contain 5-9g of fiber per serving. Not only will you save more than 150 calories per  cup serving, the swap will also keep you more full and you'll get a metabolism boost from the added fiber. 16. Snack on pistachios instead of macadamia nuts. Believe it or not, you get the same amount of calories from 35 pistachios (100 calories) as you would from only five macadamia nuts. 17. Chow down on kale chips rather than potato chips. This is my favorite 'don't knock it 'till you try it' swap. Kale chips are so easy to make at home, and you can spice them up with a little grated parmesan or chili powder. Plus, they're a mere fraction of the calories of potato chips, but with the same crunch factor we crave so often. 18. Add seltzer and some fruit slices to your cocktail instead of  soda or fruit juice. One cup of soda or fruit juice can pack on as much as 140 calories. Instead, use seltzer and fruit slices. The fruit provides valuable phytochemicals, such as flavonoids and anthocyanins, which help to combat cancer and stave off the aging process.

## 2019-04-09 DIAGNOSIS — Z716 Tobacco abuse counseling: Secondary | ICD-10-CM | POA: Insufficient documentation

## 2019-04-09 DIAGNOSIS — E639 Nutritional deficiency, unspecified: Secondary | ICD-10-CM | POA: Insufficient documentation

## 2019-04-13 ENCOUNTER — Other Ambulatory Visit: Payer: Self-pay | Admitting: Adult Health

## 2019-04-21 ENCOUNTER — Encounter: Payer: Self-pay | Admitting: Adult Health

## 2019-05-15 ENCOUNTER — Other Ambulatory Visit: Payer: Self-pay | Admitting: Adult Health

## 2019-05-15 DIAGNOSIS — E039 Hypothyroidism, unspecified: Secondary | ICD-10-CM

## 2019-05-15 DIAGNOSIS — F419 Anxiety disorder, unspecified: Secondary | ICD-10-CM

## 2019-05-15 MED ORDER — LEVOTHYROXINE SODIUM 200 MCG PO TABS: 200.0000 ug | ORAL_TABLET | Freq: Every day | ORAL | 0 refills | Status: DC

## 2019-05-15 MED ORDER — SERTRALINE HCL 100 MG PO TABS: 100.0000 mg | ORAL_TABLET | Freq: Every day | ORAL | 0 refills | Status: DC

## 2019-05-15 MED ORDER — DULAGLUTIDE 1.5 MG/0.5ML ~~LOC~~ SOAJ: 0.5000 mL | SUBCUTANEOUS | 0 refills | Status: DC

## 2019-05-15 MED ORDER — ROSUVASTATIN CALCIUM 40 MG PO TABS: 40.0000 mg | ORAL_TABLET | Freq: Every day | ORAL | 0 refills | Status: DC

## 2019-05-15 MED ORDER — FENOFIBRATE 145 MG PO TABS: 145.0000 mg | ORAL_TABLET | Freq: Every day | ORAL | 0 refills | Status: DC

## 2019-05-15 MED ORDER — BUSPIRONE HCL 7.5 MG PO TABS: 7.5000 mg | ORAL_TABLET | Freq: Two times a day (BID) | ORAL | 2 refills | Status: DC

## 2019-05-15 MED ORDER — VALSARTAN-HYDROCHLOROTHIAZIDE 160-25 MG PO TABS: 1.0000 | ORAL_TABLET | Freq: Every day | ORAL | 0 refills | Status: DC

## 2019-05-15 MED ORDER — METFORMIN HCL 1000 MG PO TABS: 1000.0000 mg | ORAL_TABLET | Freq: Two times a day (BID) | ORAL | 1 refills | Status: DC

## 2019-05-15 NOTE — Addendum Note (Signed)
Addended by: Stan Head on: 05/15/2019 11:54 AM   Modules accepted: Orders

## 2019-05-15 NOTE — Telephone Encounter (Signed)
We have not prescribed levothyroxine for the patient previously.  Please review and refill if appropriate.  Tiajuana Amass, CMA

## 2019-05-15 NOTE — Telephone Encounter (Signed)
Patient is requesting a refill of all her meds that we handle, she especially needs her metformin as she is completely out, if we can get this sent today. If approved please send orders to [facility] on [address]

## 2019-05-17 ENCOUNTER — Other Ambulatory Visit: Payer: Self-pay | Admitting: Adult Health

## 2019-05-24 ENCOUNTER — Ambulatory Visit: Payer: BC Managed Care – PPO | Admitting: Family Medicine

## 2019-06-03 ENCOUNTER — Other Ambulatory Visit: Payer: Self-pay | Admitting: Physician Assistant

## 2019-06-06 ENCOUNTER — Other Ambulatory Visit: Payer: Self-pay | Admitting: Physician Assistant

## 2019-09-11 ENCOUNTER — Other Ambulatory Visit: Payer: Self-pay | Admitting: Adult Health

## 2019-10-12 ENCOUNTER — Other Ambulatory Visit: Payer: Self-pay | Admitting: Physician Assistant

## 2019-10-12 ENCOUNTER — Other Ambulatory Visit: Payer: Self-pay | Admitting: Adult Health

## 2019-10-26 ENCOUNTER — Other Ambulatory Visit: Payer: Self-pay | Admitting: Physician Assistant

## 2019-10-26 LAB — COLOGUARD

## 2019-11-21 ENCOUNTER — Other Ambulatory Visit: Payer: Self-pay | Admitting: Physician Assistant

## 2019-12-04 ENCOUNTER — Other Ambulatory Visit: Payer: Self-pay | Admitting: Physician Assistant

## 2019-12-04 ENCOUNTER — Telehealth: Payer: Self-pay | Admitting: Physician Assistant

## 2019-12-04 NOTE — Telephone Encounter (Signed)
Please call patient to schedule OV apt with [facility].   No further refills until patient is seen per our refill policy. AS, CMA

## 2019-12-12 ENCOUNTER — Other Ambulatory Visit: Payer: Self-pay | Admitting: Physician Assistant

## 2019-12-27 ENCOUNTER — Other Ambulatory Visit: Payer: Self-pay | Admitting: Adult Health

## 2019-12-27 DIAGNOSIS — F419 Anxiety disorder, unspecified: Secondary | ICD-10-CM

## 2020-01-23 ENCOUNTER — Other Ambulatory Visit: Payer: Self-pay | Admitting: Physician Assistant

## 2020-05-02 ENCOUNTER — Other Ambulatory Visit: Payer: Self-pay

## 2020-05-02 ENCOUNTER — Emergency Department
Admission: EM | Admit: 2020-05-02 | Discharge: 2020-05-02 | Disposition: A | Discharge status: Home / Self Care | Payer: BC Managed Care – PPO | Attending: Emergency Medicine | Admitting: Emergency Medicine

## 2020-05-02 DIAGNOSIS — I10 Essential (primary) hypertension: Secondary | ICD-10-CM | POA: Diagnosis not present

## 2020-05-02 DIAGNOSIS — Z7984 Long term (current) use of oral hypoglycemic drugs: Secondary | ICD-10-CM | POA: Insufficient documentation

## 2020-05-02 DIAGNOSIS — Z8673 Personal history of transient ischemic attack (TIA), and cerebral infarction without residual deficits: Secondary | ICD-10-CM | POA: Diagnosis not present

## 2020-05-02 DIAGNOSIS — R42 Dizziness and giddiness: Secondary | ICD-10-CM | POA: Diagnosis present

## 2020-05-02 DIAGNOSIS — E039 Hypothyroidism, unspecified: Secondary | ICD-10-CM | POA: Insufficient documentation

## 2020-05-02 DIAGNOSIS — F17210 Nicotine dependence, cigarettes, uncomplicated: Secondary | ICD-10-CM | POA: Diagnosis not present

## 2020-05-02 DIAGNOSIS — E119 Type 2 diabetes mellitus without complications: Secondary | ICD-10-CM | POA: Insufficient documentation

## 2020-05-02 DIAGNOSIS — Z79899 Other long term (current) drug therapy: Secondary | ICD-10-CM | POA: Insufficient documentation

## 2020-05-02 HISTORY — DX: Transient cerebral ischemic attack, unspecified: G45.9

## 2020-05-02 LAB — URINALYSIS, COMPLETE (UACMP) WITH MICROSCOPIC
Bacteria, UA: NONE SEEN
Bilirubin Urine: NEGATIVE
Glucose, UA: >=500 mg/dL — AB
Hgb urine dipstick: NEGATIVE
Ketones, ur: NEGATIVE mg/dL
Leukocytes,Ua: NEGATIVE
Nitrite: NEGATIVE
Protein, ur: NEGATIVE mg/dL
Specific Gravity, Urine: 1.016 (ref 1.005–1.030)
pH: 5 (ref 5.0–8.0)

## 2020-05-02 LAB — BASIC METABOLIC PANEL
Anion gap: 8 (ref 5–15)
BUN: 27 mg/dL — ABNORMAL HIGH (ref 6–20)
CO2: 25 mmol/L (ref 22–32)
Calcium: 9.2 mg/dL (ref 8.9–10.3)
Chloride: 103 mmol/L (ref 98–111)
Creatinine, Ser: 0.74 mg/dL (ref 0.44–1.00)
GFR, Estimated: >60 mL/min (ref >60)
Glucose, Bld: 123 mg/dL — ABNORMAL HIGH (ref 70–99)
Potassium: 3.8 mmol/L (ref 3.5–5.1)
Sodium: 136 mmol/L (ref 135–145)

## 2020-05-02 LAB — CBC
HCT: 44.9 % (ref 36.0–46.0)
Hemoglobin: 14.8 g/dL (ref 12.0–15.0)
MCH: 31 pg (ref 26.0–34.0)
MCHC: 33 g/dL (ref 30.0–36.0)
MCV: 93.9 fL (ref 80.0–100.0)
Platelets: 268 10*3/uL (ref 150–400)
RBC: 4.78 MIL/uL (ref 3.87–5.11)
RDW: 13.2 % (ref 11.5–15.5)
WBC: 9.9 10*3/uL (ref 4.0–10.5)
nRBC: 0 % (ref 0.0–0.2)

## 2020-05-02 MED ORDER — MECLIZINE HCL 25 MG PO TABS: 25.0000 mg | ORAL_TABLET | Freq: Three times a day (TID) | ORAL | 0 refills | Status: DC | PRN

## 2020-05-02 MED ORDER — MECLIZINE HCL 25 MG PO TABS
25.0000 mg | ORAL_TABLET | Freq: Once | ORAL | Status: AC
  Administered 2020-05-02: 25 mg via ORAL
  Filled 2020-05-02: qty 1

## 2020-05-02 NOTE — ED Triage Notes (Signed)
Pt comes into the ED via [facility] from work where she has episodes of dizziness and paleness in color.  Per EMS, negative for orthostatic changes.  EMS did see a blockage on EKG.  Pt denies any cardiac events.  Pt alert and oriented x4.  Pt has h/o HTN, DM type 2, and hypothyroidism.  Pt currently in NAD at this time with even and unlabored respirations.

## 2020-05-02 NOTE — ED Provider Notes (Signed)
[facility] Provider Note   ____________________________________________   I have reviewed the triage vital signs and the nursing notes.   HISTORY  Chief Complaint Dizziness   History limited by: Not Limited   HPI [name] is a 55 y.o. female who presents to the emergency department today because of concerns for dizziness.  Patient works as a Pharmacist, hospital and states she was walking down the hallway when all of a sudden she felt dizzy.  She went to the assistant principal's office and sat down.  The dizziness did abate after 10 to 15 minutes.  She then tried getting up when the dizziness came back.  It occurred 1 more time when she was walking to the ambulance after EMS arrived.  The time my exam while she is sitting in the stretcher she denies any dizziness.  She denies any unusual activity today.  She denies any change in oral intake.    Records reviewed. Per medical record review patient has a history of DM, HTN, HLD.   Past Medical History:  Diagnosis Date  . Anxiety and depression   . Diabetes mellitus   . GERD (gastroesophageal reflux disease)   . HTN (hypertension)   . Hyperlipidemia   . Hypothyroidism   . TIA (transient ischemic attack)     Patient Active Problem List   Diagnosis Date Noted  . Tobacco abuse counseling 04/09/2019  . Poor eating habits 04/09/2019  . Healthcare maintenance 02/22/2019  . Uncontrolled diabetes mellitus ([place]) 07/03/2017  . Hypertension associated with diabetes ([place]) 07/03/2017  . Depressive disorder 09/08/2016  . Chronic obstructive lung disease ([place]) 09/08/2016  . Hyperlipidemia 09/08/2016  . Morbid obesity ([place]) 08/20/2016  . Hyperlipidemia associated with type 2 diabetes mellitus ([place]) 08/20/2016  . History of TIA (transient ischemic attack) 05/05/2011  . Hypothyroid 08/08/2010  . Tobacco abuse 07/10/2010    Past Surgical History:  Procedure Laterality Date  . ABDOMINAL  HYSTERECTOMY    . Ankle surgery x 2    . Exploratory Laparoscopy    . PARTIAL HYSTERECTOMY      Prior to Admission medications   Medication Sig Start Date End Date Taking? Authorizing Provider  albuterol (VENTOLIN HFA) 108 (90 Base) MCG/ACT inhaler Inhale 2 puffs into the lungs every 4 (four) hours as needed for wheezing or shortness of breath. 02/10/19   Henderly, Britni A, PA-C  blood glucose meter kit and supplies Dispense based on patient and insurance preference. Use up to four times daily as directed. (FOR ICD-10 E10.9, E11.9). 02/22/19   Danford, Valetta Fuller D, NP  busPIRone (BUSPAR) 7.5 MG tablet Take 1 tablet (7.5 mg total) by mouth 2 (two) times daily. 05/15/19   Lorrene Reid, PA-C  fenofibrate (TRICOR) 145 MG tablet Take 1 tablet (145 mg total) by mouth daily. 05/15/19   Lorrene Reid, PA-C  levothyroxine (SYNTHROID) 200 MCG tablet Take 1 tablet (200 mcg total) by mouth daily. 05/15/19   Abonza, Herb Grays, PA-C  metFORMIN (GLUCOPHAGE) 1000 MG tablet TAKE 1 TABLET (1,000 MG TOTAL) BY MOUTH 2 (TWO) TIMES DAILY WITH A MEAL. 12/04/19 02/02/20  Abonza, Maritza, PA-C  ONETOUCH ULTRA test strip USE UP TO 4 TIMES A DAY AS DIRECTED 03/13/19   Danford, Valetta Fuller D, NP  rosuvastatin (CRESTOR) 40 MG tablet Take 1 tablet (40 mg total) by mouth daily. 05/15/19   Abonza, Herb Grays, PA-C  sertraline (ZOLOFT) 100 MG tablet TAKE 1 TABLET (100 MG TOTAL) BY MOUTH DAILY. **PATIENT NEEDS APT FOR FURTHER REFILLS** 12/04/19  Lorrene Reid, PA-C  SYMBICORT 160-4.5 MCG/ACT inhaler Inhale 2 puffs into the lungs 2 (two) times daily. 02/05/19   Hall-Potvin, [place], PA-C  TRULICITY 1.5 XT/0.6YI SOPN INJECT 0.5 MLS INTO THE SKIN ONCE A WEEK. 06/05/19   Abonza, Maritza, PA-C  valsartan-hydrochlorothiazide (DIOVAN-HCT) 160-25 MG tablet Take 1 tablet by mouth daily. **PATIENT NEEDS APT FOR FURTHER REFILLS** 10/12/19   Lorrene Reid, PA-C    Allergies Compazine  [prochlorperazine], Thiethylperazine, Vancomycin, Compazine, and Lasix  [furosemide]  Family History  Problem Relation Age of Onset  . Heart attack Father   . Diabetes Father   . Hyperlipidemia Father   . Heart attack Paternal Uncle   . Heart attack Maternal Grandfather   . Hyperlipidemia Maternal Grandfather   . Heart attack Paternal Grandfather   . Hyperlipidemia Paternal Grandfather   . Stroke Maternal Grandmother   . Hyperlipidemia Maternal Grandmother   . Stroke Paternal Grandmother   . Hyperlipidemia Paternal Grandmother     Social History Social History   Tobacco Use  . Smoking status: Current Every Day Smoker    Packs/day: 1.00    Years: 40.00    Pack years: 40.00    Types: Cigarettes  . Smokeless tobacco: Never Used  Vaping Use  . Vaping Use: Never used  Substance Use Topics  . Alcohol use: Not Currently  . Drug use: No    Review of Systems Constitutional: No fever/chills Eyes: No visual changes. ENT: No sore throat. Cardiovascular: Denies chest pain. Respiratory: Denies shortness of breath. Gastrointestinal: No abdominal pain.  No nausea, no vomiting.  No diarrhea.   Genitourinary: Negative for dysuria. Musculoskeletal: Negative for back pain. Skin: Negative for rash. Neurological: Positive for dizziness.  ____________________________________________   PHYSICAL EXAM:  VITAL SIGNS: ED Triage Vitals  Enc Vitals Group     BP 05/02/20 1314 134/83     Pulse Rate 05/02/20 1314 81     Resp 05/02/20 1314 18     Temp 05/02/20 1314 98.4 F (36.9 C)     Temp Source 05/02/20 1314 Oral     SpO2 05/02/20 1314 95 %     Weight 05/02/20 1315 210 lb (95.3 kg)     Height 05/02/20 1315 _0  (1.6 m)     Head Circumference --      Peak Flow --      Pain Score 05/02/20 1315 5   Constitutional: Alert and oriented.  Eyes: Conjunctivae are normal.  ENT      Head: Normocephalic and atraumatic.      Nose: No congestion/rhinnorhea.      Mouth/Throat: Mucous membranes are moist.      Neck: No  stridor. Hematological/Lymphatic/Immunilogical: No cervical lymphadenopathy. Cardiovascular: Normal rate, regular rhythm.  No murmurs, rubs, or gallops.  Respiratory: Normal respiratory effort without tachypnea nor retractions. Breath sounds are clear and equal bilaterally. No wheezes/rales/rhonchi. Gastrointestinal: Soft and non tender. No rebound. No guarding.  Genitourinary: Deferred Musculoskeletal: Normal range of motion in all extremities. No lower extremity edema. Neurologic:  Normal speech and language. No gross focal neurologic deficits are appreciated.  Skin:  Skin is warm, dry and intact. No rash noted. Psychiatric: Mood and affect are normal. Speech and behavior are normal. Patient exhibits appropriate insight and judgment.  ____________________________________________    LABS (pertinent positives/negatives)  BMP wnl except glu 123, BUN 27 CBC wbc 9.9, hgb 14.8, plt 268 UA clear, glucose >500 otherwise unremarkable  ____________________________________________   EKG  I, Nance Pear, attending physician, personally viewed and interpreted  this EKG  EKG Time: 1320 Rate: 88 Rhythm: Normal sinus rhythm Axis: normal Intervals: qtc 479 QRS: RBBB ST changes: no st elevation Impression: abnormal ekg ____________________________________________    RADIOLOGY  None   ____________________________________________   PROCEDURES  Procedures  ____________________________________________   INITIAL IMPRESSION / ASSESSMENT AND PLAN / ED COURSE  Pertinent labs & imaging results that were available during my care of the patient were reviewed by me and considered in my medical decision making (see chart for details).   Patient presented to the emergency department today because of concerns for dizziness.  The patient did state that it occurred while she was up walking.  No dizziness at the time my exam.  No recent illness for the patient.  Blood work today without any  concerning electrolyte abnormality.  At this time I do think vertigo likely.  I doubt central lesion given the positional nature and intermittent status.  Patient was given meclizine and was able to ambulate without difficulty afterwards.  This time things reasonable for patient be discharged home.  Discussed with patient importance of primary care follow-up. ____________________________________________   FINAL CLINICAL IMPRESSION(S) / ED DIAGNOSES  Final diagnoses:  Dizziness  Vertigo     Note: This dictation was prepared with Dragon dictation. Any transcriptional errors that result from this process are unintentional     Nance Pear, MD 05/02/20 1538

## 2020-05-02 NOTE — Discharge Instructions (Addendum)
Please seek medical attention for any high fevers, chest pain, shortness of breath, change in behavior, persistent vomiting, bloody stool or any other new or concerning symptoms.  

## 2020-08-14 ENCOUNTER — Emergency Department (HOSPITAL_COMMUNITY): Payer: BC Managed Care – PPO

## 2020-08-14 ENCOUNTER — Observation Stay (HOSPITAL_COMMUNITY)
Admission: EM | Admit: 2020-08-14 | Discharge: 2020-08-15 | Disposition: A | Discharge status: Home / Self Care | Payer: BC Managed Care – PPO | Attending: Internal Medicine | Admitting: Internal Medicine

## 2020-08-14 ENCOUNTER — Encounter (HOSPITAL_COMMUNITY): Payer: Self-pay | Admitting: Internal Medicine

## 2020-08-14 ENCOUNTER — Inpatient Hospital Stay (HOSPITAL_COMMUNITY): Payer: BC Managed Care – PPO

## 2020-08-14 ENCOUNTER — Inpatient Hospital Stay (HOSPITAL_COMMUNITY)
Admit: 2020-08-14 | Discharge: 2020-08-14 | Disposition: A | Discharge status: Home / Self Care | Payer: BC Managed Care – PPO | Attending: Emergency Medicine | Admitting: Emergency Medicine

## 2020-08-14 ENCOUNTER — Other Ambulatory Visit: Payer: Self-pay

## 2020-08-14 DIAGNOSIS — I509 Heart failure, unspecified: Secondary | ICD-10-CM | POA: Diagnosis not present

## 2020-08-14 DIAGNOSIS — Z8673 Personal history of transient ischemic attack (TIA), and cerebral infarction without residual deficits: Secondary | ICD-10-CM

## 2020-08-14 DIAGNOSIS — E1165 Type 2 diabetes mellitus with hyperglycemia: Secondary | ICD-10-CM | POA: Diagnosis present

## 2020-08-14 DIAGNOSIS — I5033 Acute on chronic diastolic (congestive) heart failure: Secondary | ICD-10-CM | POA: Diagnosis present

## 2020-08-14 DIAGNOSIS — Z659 Problem related to unspecified psychosocial circumstances: Secondary | ICD-10-CM

## 2020-08-14 DIAGNOSIS — Y929 Unspecified place or not applicable: Secondary | ICD-10-CM | POA: Diagnosis not present

## 2020-08-14 DIAGNOSIS — F419 Anxiety disorder, unspecified: Secondary | ICD-10-CM | POA: Diagnosis present

## 2020-08-14 DIAGNOSIS — Z91040 Latex allergy status: Secondary | ICD-10-CM | POA: Diagnosis not present

## 2020-08-14 DIAGNOSIS — E119 Type 2 diabetes mellitus without complications: Secondary | ICD-10-CM | POA: Diagnosis not present

## 2020-08-14 DIAGNOSIS — Z91128 Patient's intentional underdosing of medication regimen for other reason: Secondary | ICD-10-CM | POA: Diagnosis not present

## 2020-08-14 DIAGNOSIS — I4510 Unspecified right bundle-branch block: Secondary | ICD-10-CM | POA: Diagnosis present

## 2020-08-14 DIAGNOSIS — F32A Depression, unspecified: Secondary | ICD-10-CM | POA: Diagnosis present

## 2020-08-14 DIAGNOSIS — R7989 Other specified abnormal findings of blood chemistry: Secondary | ICD-10-CM | POA: Insufficient documentation

## 2020-08-14 DIAGNOSIS — F17210 Nicotine dependence, cigarettes, uncomplicated: Secondary | ICD-10-CM | POA: Diagnosis not present

## 2020-08-14 DIAGNOSIS — Z9114 Patient's other noncompliance with medication regimen: Secondary | ICD-10-CM | POA: Diagnosis not present

## 2020-08-14 DIAGNOSIS — Z833 Family history of diabetes mellitus: Secondary | ICD-10-CM

## 2020-08-14 DIAGNOSIS — H532 Diplopia: Secondary | ICD-10-CM | POA: Diagnosis present

## 2020-08-14 DIAGNOSIS — Z83438 Family history of other disorder of lipoprotein metabolism and other lipidemia: Secondary | ICD-10-CM

## 2020-08-14 DIAGNOSIS — I110 Hypertensive heart disease with heart failure: Secondary | ICD-10-CM | POA: Diagnosis present

## 2020-08-14 DIAGNOSIS — G9341 Metabolic encephalopathy: Secondary | ICD-10-CM | POA: Diagnosis present

## 2020-08-14 DIAGNOSIS — Z20822 Contact with and (suspected) exposure to covid-19: Secondary | ICD-10-CM | POA: Diagnosis not present

## 2020-08-14 DIAGNOSIS — T381X6A Underdosing of thyroid hormones and substitutes, initial encounter: Secondary | ICD-10-CM | POA: Diagnosis present

## 2020-08-14 DIAGNOSIS — Z7982 Long term (current) use of aspirin: Secondary | ICD-10-CM | POA: Diagnosis not present

## 2020-08-14 DIAGNOSIS — Z823 Family history of stroke: Secondary | ICD-10-CM

## 2020-08-14 DIAGNOSIS — G40909 Epilepsy, unspecified, not intractable, without status epilepticus: Secondary | ICD-10-CM | POA: Insufficient documentation

## 2020-08-14 DIAGNOSIS — E785 Hyperlipidemia, unspecified: Secondary | ICD-10-CM | POA: Diagnosis present

## 2020-08-14 DIAGNOSIS — Z888 Allergy status to other drugs, medicaments and biological substances status: Secondary | ICD-10-CM

## 2020-08-14 DIAGNOSIS — R778 Other specified abnormalities of plasma proteins: Secondary | ICD-10-CM

## 2020-08-14 DIAGNOSIS — Z91148 Patient's other noncompliance with medication regimen for other reason: Secondary | ICD-10-CM

## 2020-08-14 DIAGNOSIS — R569 Unspecified convulsions: Secondary | ICD-10-CM

## 2020-08-14 DIAGNOSIS — I700 Atherosclerosis of aorta: Secondary | ICD-10-CM | POA: Diagnosis not present

## 2020-08-14 DIAGNOSIS — E1159 Type 2 diabetes mellitus with other circulatory complications: Secondary | ICD-10-CM

## 2020-08-14 DIAGNOSIS — I2510 Atherosclerotic heart disease of native coronary artery without angina pectoris: Secondary | ICD-10-CM | POA: Diagnosis present

## 2020-08-14 DIAGNOSIS — R55 Syncope and collapse: Secondary | ICD-10-CM | POA: Diagnosis not present

## 2020-08-14 DIAGNOSIS — I10 Essential (primary) hypertension: Secondary | ICD-10-CM | POA: Diagnosis not present

## 2020-08-14 DIAGNOSIS — Z79899 Other long term (current) drug therapy: Secondary | ICD-10-CM | POA: Insufficient documentation

## 2020-08-14 DIAGNOSIS — R4182 Altered mental status, unspecified: Secondary | ICD-10-CM | POA: Diagnosis present

## 2020-08-14 DIAGNOSIS — Z79890 Hormone replacement therapy (postmenopausal): Secondary | ICD-10-CM

## 2020-08-14 DIAGNOSIS — E039 Hypothyroidism, unspecified: Secondary | ICD-10-CM | POA: Diagnosis present

## 2020-08-14 DIAGNOSIS — J449 Chronic obstructive pulmonary disease, unspecified: Secondary | ICD-10-CM | POA: Diagnosis present

## 2020-08-14 DIAGNOSIS — Z7984 Long term (current) use of oral hypoglycemic drugs: Secondary | ICD-10-CM

## 2020-08-14 DIAGNOSIS — Z6838 Body mass index (BMI) 38.0-38.9, adult: Secondary | ICD-10-CM

## 2020-08-14 DIAGNOSIS — Z8249 Family history of ischemic heart disease and other diseases of the circulatory system: Secondary | ICD-10-CM

## 2020-08-14 DIAGNOSIS — K219 Gastro-esophageal reflux disease without esophagitis: Secondary | ICD-10-CM | POA: Diagnosis present

## 2020-08-14 DIAGNOSIS — F309 Manic episode, unspecified: Secondary | ICD-10-CM

## 2020-08-14 HISTORY — DX: Unspecified convulsions: R56.9

## 2020-08-14 LAB — URINALYSIS, ROUTINE W REFLEX MICROSCOPIC
Bacteria, UA: NONE SEEN
Bilirubin Urine: NEGATIVE
Glucose, UA: >=500 mg/dL — AB
Hgb urine dipstick: NEGATIVE
Ketones, ur: NEGATIVE mg/dL
Leukocytes,Ua: NEGATIVE
Nitrite: NEGATIVE
Protein, ur: 100 mg/dL — AB
Specific Gravity, Urine: 1.018 (ref 1.005–1.030)
pH: 5 (ref 5.0–8.0)

## 2020-08-14 LAB — CBC WITH DIFFERENTIAL/PLATELET
Abs Immature Granulocytes: 0.08 10*3/uL — ABNORMAL HIGH (ref 0.00–0.07)
Basophils Absolute: 0.1 10*3/uL (ref 0.0–0.1)
Basophils Relative: 1 %
Eosinophils Absolute: 0.2 10*3/uL (ref 0.0–0.5)
Eosinophils Relative: 2 %
HCT: 44.1 % (ref 36.0–46.0)
Hemoglobin: 14.7 g/dL (ref 12.0–15.0)
Immature Granulocytes: 1 %
Lymphocytes Relative: 12 %
Lymphs Abs: 1.1 10*3/uL (ref 0.7–4.0)
MCH: 31.1 pg (ref 26.0–34.0)
MCHC: 33.3 g/dL (ref 30.0–36.0)
MCV: 93.4 fL (ref 80.0–100.0)
Monocytes Absolute: 0.6 10*3/uL (ref 0.1–1.0)
Monocytes Relative: 7 %
Neutro Abs: 7.4 10*3/uL (ref 1.7–7.7)
Neutrophils Relative %: 77 %
Platelets: 253 10*3/uL (ref 150–400)
RBC: 4.72 MIL/uL (ref 3.87–5.11)
RDW: 13.8 % (ref 11.5–15.5)
WBC: 9.6 10*3/uL (ref 4.0–10.5)
nRBC: 0 % (ref 0.0–0.2)

## 2020-08-14 LAB — COMPREHENSIVE METABOLIC PANEL
ALT: 36 U/L (ref 0–44)
AST: 53 U/L — ABNORMAL HIGH (ref 15–41)
Albumin: 3.4 g/dL — ABNORMAL LOW (ref 3.5–5.0)
Alkaline Phosphatase: 79 U/L (ref 38–126)
Anion gap: 11 (ref 5–15)
BUN: 12 mg/dL (ref 6–20)
CO2: 22 mmol/L (ref 22–32)
Calcium: 8.7 mg/dL — ABNORMAL LOW (ref 8.9–10.3)
Chloride: 101 mmol/L (ref 98–111)
Creatinine, Ser: 0.92 mg/dL (ref 0.44–1.00)
GFR, Estimated: >60 mL/min (ref >60)
Glucose, Bld: 359 mg/dL — ABNORMAL HIGH (ref 70–99)
Potassium: 4.6 mmol/L (ref 3.5–5.1)
Sodium: 134 mmol/L — ABNORMAL LOW (ref 135–145)
Total Bilirubin: 1.3 mg/dL — ABNORMAL HIGH (ref 0.3–1.2)
Total Protein: 6.4 g/dL — ABNORMAL LOW (ref 6.5–8.1)

## 2020-08-14 LAB — GLUCOSE, CAPILLARY
Glucose-Capillary: 196 mg/dL — ABNORMAL HIGH (ref 70–99)
Glucose-Capillary: 194 mg/dL — ABNORMAL HIGH (ref 70–99)

## 2020-08-14 LAB — BLOOD GAS, VENOUS
Acid-Base Excess: 3.8 mmol/L — ABNORMAL HIGH (ref 0.0–2.0)
Bicarbonate: 28.8 mmol/L — ABNORMAL HIGH (ref 20.0–28.0)
Drawn by: 1679
FIO2: 28
O2 Saturation: 86.4 %
Patient temperature: 37
pCO2, Ven: 52.2 mmHg (ref 44.0–60.0)
pH, Ven: 7.361 (ref 7.250–7.430)
pO2, Ven: 55 mmHg — ABNORMAL HIGH (ref 32.0–45.0)

## 2020-08-14 LAB — HEMOGLOBIN A1C
Hgb A1c MFr Bld: 8.8 % — ABNORMAL HIGH (ref 4.8–5.6)
Mean Plasma Glucose: 205.86 mg/dL

## 2020-08-14 LAB — CK: Total CK: 110 U/L (ref 38–234)

## 2020-08-14 LAB — TSH: TSH: 37.58 u[IU]/mL — ABNORMAL HIGH (ref 0.350–4.500)

## 2020-08-14 LAB — RAPID URINE DRUG SCREEN, HOSP PERFORMED
Amphetamines: NOT DETECTED
Barbiturates: NOT DETECTED
Benzodiazepines: NOT DETECTED
Cocaine: NOT DETECTED
Opiates: NOT DETECTED
Tetrahydrocannabinol: NOT DETECTED

## 2020-08-14 LAB — SARS CORONAVIRUS 2 (TAT 6-24 HRS): SARS Coronavirus 2: NEGATIVE

## 2020-08-14 LAB — MAGNESIUM: Magnesium: 1.6 mg/dL — ABNORMAL LOW (ref 1.7–2.4)

## 2020-08-14 LAB — TROPONIN I (HIGH SENSITIVITY)
Troponin I (High Sensitivity): 42 ng/L — ABNORMAL HIGH (ref <18)
Troponin I (High Sensitivity): 82 ng/L — ABNORMAL HIGH (ref <18)
Troponin I (High Sensitivity): 167 ng/L (ref <18)
Troponin I (High Sensitivity): 204 ng/L (ref <18)

## 2020-08-14 LAB — ETHANOL: Alcohol, Ethyl (B): <10 mg/dL (ref <10)

## 2020-08-14 LAB — HIV ANTIBODY (ROUTINE TESTING W REFLEX): HIV Screen 4th Generation wRfx: NONREACTIVE

## 2020-08-14 MED ORDER — BUSPIRONE HCL 5 MG PO TABS
7.5000 mg | ORAL_TABLET | Freq: Two times a day (BID) | ORAL | Status: DC
  Administered 2020-08-14 – 2020-08-15 (×3): 7.5 mg via ORAL
  Filled 2020-08-14: qty 2
  Filled 2020-08-14 (×2): qty 1
  Filled 2020-08-14: qty 2

## 2020-08-14 MED ORDER — LORAZEPAM 2 MG/ML IJ SOLN
1.0000 mg | Freq: Once | INTRAMUSCULAR | Status: AC
  Administered 2020-08-14: 1 mg via INTRAVENOUS
  Filled 2020-08-14: qty 1

## 2020-08-14 MED ORDER — LEVOTHYROXINE SODIUM 100 MCG PO TABS: 200.0000 ug | ORAL_TABLET | Freq: Every day | ORAL | Status: DC

## 2020-08-14 MED ORDER — METFORMIN HCL ER 500 MG PO TB24
1000.0000 mg | ORAL_TABLET | Freq: Every day | ORAL | Status: DC
  Administered 2020-08-15: 1000 mg via ORAL
  Filled 2020-08-14 (×2): qty 2

## 2020-08-14 MED ORDER — BUMETANIDE 0.25 MG/ML IJ SOLN
0.5000 mg | Freq: Once | INTRAMUSCULAR | Status: AC
  Administered 2020-08-14: 0.5 mg via INTRAVENOUS
  Filled 2020-08-14: qty 2

## 2020-08-14 MED ORDER — MECLIZINE HCL 25 MG PO TABS: 25.0000 mg | ORAL_TABLET | Freq: Three times a day (TID) | ORAL | Status: DC | PRN

## 2020-08-14 MED ORDER — IRBESARTAN 75 MG PO TABS
150.0000 mg | ORAL_TABLET | Freq: Every day | ORAL | Status: DC
  Administered 2020-08-14 – 2020-08-15 (×2): 150 mg via ORAL
  Filled 2020-08-14: qty 1
  Filled 2020-08-14: qty 2

## 2020-08-14 MED ORDER — ASPIRIN 325 MG PO TABS
325.0000 mg | ORAL_TABLET | Freq: Every day | ORAL | Status: DC
  Administered 2020-08-14 – 2020-08-15 (×2): 325 mg via ORAL
  Filled 2020-08-14 (×2): qty 1

## 2020-08-14 MED ORDER — ALBUTEROL SULFATE (2.5 MG/3ML) 0.083% IN NEBU: 2.5000 mg | INHALATION_SOLUTION | RESPIRATORY_TRACT | Status: DC | PRN

## 2020-08-14 MED ORDER — LORAZEPAM 2 MG/ML IJ SOLN
4.0000 mg | INTRAMUSCULAR | Status: DC | PRN
Start: 2020-08-14 — End: 2020-08-15

## 2020-08-14 MED ORDER — MAGNESIUM OXIDE -MG SUPPLEMENT 400 (240 MG) MG PO TABS
800.0000 mg | ORAL_TABLET | Freq: Two times a day (BID) | ORAL | Status: AC
  Administered 2020-08-14 (×2): 800 mg via ORAL
  Filled 2020-08-14 (×2): qty 2

## 2020-08-14 MED ORDER — EMPAGLIFLOZIN 10 MG PO TABS
10.0000 mg | ORAL_TABLET | Freq: Every day | ORAL | Status: DC
  Administered 2020-08-14 – 2020-08-15 (×2): 10 mg via ORAL
  Filled 2020-08-14 (×2): qty 1

## 2020-08-14 MED ORDER — ROSUVASTATIN CALCIUM 20 MG PO TABS
40.0000 mg | ORAL_TABLET | Freq: Every evening | ORAL | Status: DC
  Administered 2020-08-14: 40 mg via ORAL
  Filled 2020-08-14: qty 2

## 2020-08-14 MED ORDER — INSULIN ASPART 100 UNIT/ML IJ SOLN
0.0000 [IU] | Freq: Three times a day (TID) | INTRAMUSCULAR | Status: DC
  Administered 2020-08-14: 3 [IU] via SUBCUTANEOUS
  Administered 2020-08-15: 5 [IU] via SUBCUTANEOUS
  Administered 2020-08-15: 2 [IU] via SUBCUTANEOUS

## 2020-08-14 MED ORDER — ONDANSETRON HCL 4 MG PO TABS: 4.0000 mg | ORAL_TABLET | Freq: Four times a day (QID) | ORAL | Status: DC | PRN

## 2020-08-14 MED ORDER — VALSARTAN-HYDROCHLOROTHIAZIDE 160-25 MG PO TABS: 1.0000 | ORAL_TABLET | Freq: Every day | ORAL | Status: DC

## 2020-08-14 MED ORDER — LORAZEPAM 2 MG/ML IJ SOLN: 4.0000 mg | INTRAMUSCULAR | Status: DC

## 2020-08-14 MED ORDER — LEVOTHYROXINE SODIUM 100 MCG PO TABS
200.0000 ug | ORAL_TABLET | Freq: Every day | ORAL | Status: DC
  Filled 2020-08-14: qty 2

## 2020-08-14 MED ORDER — ENOXAPARIN SODIUM 40 MG/0.4ML IJ SOSY
40.0000 mg | PREFILLED_SYRINGE | INTRAMUSCULAR | Status: DC
  Administered 2020-08-14: 40 mg via SUBCUTANEOUS
  Filled 2020-08-14: qty 0.4

## 2020-08-14 MED ORDER — ACETAMINOPHEN 500 MG PO TABS: 1000.0000 mg | ORAL_TABLET | Freq: Four times a day (QID) | ORAL | Status: DC | PRN

## 2020-08-14 MED ORDER — IOHEXOL 350 MG/ML SOLN
100.0000 mL | Freq: Once | INTRAVENOUS | Status: AC | PRN
  Administered 2020-08-14: 100 mL via INTRAVENOUS

## 2020-08-14 MED ORDER — ONDANSETRON HCL 4 MG/2ML IJ SOLN: 4.0000 mg | Freq: Four times a day (QID) | INTRAMUSCULAR | Status: DC | PRN

## 2020-08-14 MED ORDER — ASPIRIN-ACETAMINOPHEN-CAFFEINE 250-250-65 MG PO TABS: 1.0000 | ORAL_TABLET | Freq: Every day | ORAL | Status: DC | PRN

## 2020-08-14 MED ORDER — LEVOTHYROXINE SODIUM 100 MCG PO TABS
100.0000 ug | ORAL_TABLET | Freq: Every day | ORAL | Status: DC
  Administered 2020-08-15: 100 ug via ORAL
  Filled 2020-08-14: qty 1

## 2020-08-14 MED ORDER — NAPROXEN 250 MG PO TABS
250.0000 mg | ORAL_TABLET | Freq: Two times a day (BID) | ORAL | Status: DC | PRN
  Filled 2020-08-14: qty 1

## 2020-08-14 MED ORDER — FLUTICASONE FUROATE-VILANTEROL 200-25 MCG/INH IN AEPB
1.0000 | INHALATION_SPRAY | Freq: Every day | RESPIRATORY_TRACT | Status: DC
  Filled 2020-08-14: qty 28

## 2020-08-14 MED ORDER — BUPROPION HCL ER (XL) 150 MG PO TB24
300.0000 mg | ORAL_TABLET | Freq: Every day | ORAL | Status: DC
  Administered 2020-08-15: 300 mg via ORAL
  Filled 2020-08-14: qty 2

## 2020-08-14 MED ORDER — LABETALOL HCL 5 MG/ML IV SOLN: 10.0000 mg | Freq: Four times a day (QID) | INTRAVENOUS | Status: DC | PRN

## 2020-08-14 MED ORDER — GADOBUTROL 1 MMOL/ML IV SOLN
9.0000 mL | Freq: Once | INTRAVENOUS | Status: AC | PRN
  Administered 2020-08-14: 9 mL via INTRAVENOUS

## 2020-08-14 MED ORDER — HYDROCHLOROTHIAZIDE 25 MG PO TABS
25.0000 mg | ORAL_TABLET | Freq: Every day | ORAL | Status: DC
  Administered 2020-08-14 – 2020-08-15 (×2): 25 mg via ORAL
  Filled 2020-08-14 (×2): qty 1

## 2020-08-14 MED ORDER — FENOFIBRATE 160 MG PO TABS
160.0000 mg | ORAL_TABLET | Freq: Every day | ORAL | Status: DC
  Administered 2020-08-15: 160 mg via ORAL
  Filled 2020-08-14: qty 1

## 2020-08-14 MED ORDER — ONDANSETRON HCL 4 MG/2ML IJ SOLN
4.0000 mg | INTRAMUSCULAR | Status: AC
  Administered 2020-08-14: 4 mg via INTRAVENOUS
  Filled 2020-08-14: qty 2

## 2020-08-14 NOTE — Consult Note (Signed)
Neurology Consultation  Reason for Consult: seizure like activity.  Referring Physician: Hazle Nordmann, MD ED.  CC: seizure like activity.   History is obtained from: chart, patient, and female relative at bedside.  HPI: Ms. Estey is a 55 yo female who appears much older than stated age with a PMHx of HTN, uncontrolled DM II,  HLD, TIA x 3, anxiety, depression, tobacco abuse, and non adherence to medical plan. Patient presented today via EMS after seizure like activity at home. Female in the ED room states that the patient felt wobbly and sat down on the porch. After which, sister witnessed patient confused then a 20 second tonic clonic shaking activity. She was confused post event but mental status cleared en route to hospital, and at this time the patient is near baseline but continues to have some mild confusion/word finding difficulty. No reported sleepiness afterward.   Upon NP taking history, patient states her grandson showed her a drawing and the drawing was moving around. She has never had this type of event prior to today. Her sister has seizures. No HA prior to or after this event. No diplopia or blurred vision associated with event. She admits to taking Goody Powders frequently.   She denies any TBI, brain bleed, brain tumor, or concussion. She states that some times her head will twitch and she has chronic tremors in her hands that occur at random and not just when reaching for objects. No history of staring spells, twitching, or jerking before.   Patient tells NP that she would like to go home, but then says she has been through multiple tests and visits to MDs to try and figure out her dizziness and other issues she has had in the past. Patient tells NP that "when you do the MRI, you are going to see white matter" which one MD told her that was a sign of MS.   Workup thus far revealed a TSH of 37.581, CTH with CMVD, Mg++ of 1.6, and MRI brain with and without contrast is pending.   In  review of chart, patient was hospitalized here in 2012 with transient speech abnormality possible TIA [email or web address] [facility]. Last PCP visit was 04/05/19 per Epic. At that time, patient's A1c was 9.4%. Neurology visit 06/03/20 for memory changes, eye pain, double vision and numbness/tingling. LP at that time was negative for acute finding. MRI brain showed numerous punctate FLAIR signals of periventricular subcortical white matter but not consistent with demyelinating disease. She was asked to return for neuropsych evaluation, but NP does not see any result on chart or f/up visit.  To attending physician, patient notes she was dissatisfied with lack of answers from her neurologist and does not plan to return to him.  Regarding medication compliance, daughter reports that patient did not return to school forms in time to withhold pay such that she would be paid throughout the summer and therefore has not been able to afford her medications.  She reports that otherwise the patient is adherent to her medications.  Notably her A1c was 13.3 in January 2021, 9.4 in March 2021, and most recently 8.8 this admission, suggesting she has been making progress in controlling her chronic risk factors.  She also reports she quit smoking 1 week ago  Neurology was asked to consult for seizure like activity.   ROS: A robust ROS was performed and is negative except as noted in the HPI.   Past Medical History:  Diagnosis Date   Anxiety and depression  Diabetes mellitus    GERD (gastroesophageal reflux disease)    HTN (hypertension)    Hyperlipidemia    Hypothyroidism    TIA (transient ischemic attack)     Family History  Problem Relation Age of Onset   Heart attack Father    Diabetes Father    Hyperlipidemia Father    Heart attack Paternal Uncle    Heart attack Maternal Grandfather    Hyperlipidemia Maternal Grandfather    Heart attack Paternal Grandfather    Hyperlipidemia Paternal Grandfather    Stroke Maternal  Grandmother    Hyperlipidemia Maternal Grandmother    Stroke Paternal Grandmother    Hyperlipidemia Paternal Grandmother    Social History:   reports that she has been smoking cigarettes. She has a 40.00 pack-year smoking history. She has never used smokeless tobacco. She reports previous alcohol use. She reports that she does not use drugs. Quit smoking 1 week ago.  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  Medications  Current Facility-Administered Medications:    empagliflozin (JARDIANCE) tablet 10 mg, 10 mg, Oral, Daily, Noemi Chapel, MD   hydrochlorothiazide (HYDRODIURIL) tablet 25 mg, 25 mg, Oral, Daily, Noemi Chapel, MD, 25 mg at 08/14/20 1016   irbesartan (AVAPRO) tablet 150 mg, 150 mg, Oral, Daily, Noemi Chapel, MD, 150 mg at 08/14/20 1016   [START ON 08/15/2020] levothyroxine (SYNTHROID) tablet 200 mcg, 200 mcg, Oral, Q0600, Noemi Chapel, MD   LORazepam (ATIVAN) injection 1 mg, 1 mg, Intravenous, Once, Noemi Chapel, MD   Derrill Memo ON 08/15/2020] metFORMIN (GLUCOPHAGE-XR) 24 hr tablet 1,000 mg, 1,000 mg, Oral, Q breakfast, Noemi Chapel, MD  Current Outpatient Medications:    acetaminophen (TYLENOL) 500 MG tablet, Take 1,000 mg by mouth every 6 (six) hours as needed for headache or mild pain., Disp: , Rfl:    albuterol (VENTOLIN HFA) 108 (90 Base) MCG/ACT inhaler, Inhale 2 puffs into the lungs every 4 (four) hours as needed for wheezing or shortness of breath., Disp: 8 g, Rfl: 0   Aspirin-Salicylamide-Caffeine (BC HEADACHE PO), Take 1 packet by mouth daily as needed (headache/pain)., Disp: , Rfl:    buPROPion (WELLBUTRIN XL) 300 MG 24 hr tablet, Take 300 mg by mouth daily., Disp: , Rfl:    busPIRone (BUSPAR) 7.5 MG tablet, Take 1 tablet (7.5 mg total) by mouth 2 (two) times daily., Disp: 60 tablet, Rfl: 2   fenofibrate (TRICOR) 145 MG tablet, Take 1 tablet (145 mg total) by mouth daily., Disp: 30 tablet, Rfl: 0   JARDIANCE 10 MG TABS tablet, Take 10 mg by  mouth daily., Disp: , Rfl:    levothyroxine (SYNTHROID) 200 MCG tablet, Take 1 tablet (200 mcg total) by mouth daily., Disp: 30 tablet, Rfl: 0   meclizine (ANTIVERT) 25 MG tablet, Take 1 tablet (25 mg total) by mouth 3 (three) times daily as needed for dizziness., Disp: 30 tablet, Rfl: 0   metFORMIN (GLUCOPHAGE-XR) 500 MG 24 hr tablet, Take 1,000 mg by mouth in the morning and at bedtime., Disp: , Rfl:    naproxen sodium (ALEVE) 220 MG tablet, Take 440 mg by mouth 2 (two) times daily as needed (pain)., Disp: , Rfl:    rosuvastatin (CRESTOR) 40 MG tablet, Take 1 tablet (40 mg total) by mouth daily. (Patient taking differently: Take 40 mg by mouth every evening.), Disp: 30 tablet, Rfl: 0   SYMBICORT 160-4.5 MCG/ACT inhaler, Inhale 2 puffs into the lungs 2 (two) times daily., Disp: 1 Inhaler, Rfl: 0  valsartan-hydrochlorothiazide (DIOVAN-HCT) 160-25 MG tablet, Take 1 tablet by mouth daily. **PATIENT NEEDS APT FOR FURTHER REFILLS** (Patient taking differently: Take 1 tablet by mouth daily.), Disp: 30 tablet, Rfl: 0   blood glucose meter kit and supplies, Dispense based on patient and insurance preference. Use up to four times daily as directed. (FOR ICD-10 E10.9, E11.9)., Disp: 1 each, Rfl: 0   ONETOUCH ULTRA test strip, USE UP TO 4 TIMES A DAY AS DIRECTED, Disp: 100 strip, Rfl: 11  Exam: Current vital signs: BP (!) 166/114 (BP Location: Right Arm)   Pulse 94   Temp 98.6 F (37 C) (Oral)   Resp 20   SpO2 96%  Vital signs in last 24 hours: Temp:  [98.6 F (37 C)] 98.6 F (37 C) (07/13 0700) Pulse Rate:  [94-120] 94 (07/13 1017) Resp:  [17-23] 20 (07/13 1017) BP: (165-176)/(104-121) 166/114 (07/13 1017) SpO2:  [93 %-96 %] 96 % (07/13 1017)  PE: GENERAL: Chronically ill appearing female who appears much older that stated age. Morbidly obese. Awake, alert in NAD. HEENT: - Normocephalic and atraumatic, moist mucous membranes. LUNGS - Normal respiratory effort.  CV - RRR. ABDOMEN - Soft,  nontender. Ext: warm, well perfused. Psych: Affect appropriate to situation.  NEURO:  Mental Status: Awake, alert, and oriented x3. Speech/Language: speech is without dysarthria or aphasia. Naming, repetition, fluency, and comprehension intact.  Occasionally she has minor hesitations and word finding difficulties when describing medical procedures in casual speech to attending physician She is able to calculate # quarters in $2.75 and spell WORLD backwards.  Cranial Nerves:  II: PERRL 41m/brisk. visual fields full. III, IV, VI: EOMI. Lid elevation symmetric and full.  V: sensation is intact and symmetrical to face.  VII: Smile is symmetrical. Able to puff cheeks and raise eyebrows.  VIII:hearing intact to voice. IX, X: palate elevation is symmetric. Phonation normal.  XI: normal sternocleidomastoid and trapezius muscle strength. XTIW:PYKDXIis symmetrical without fasciculations.   Motor:  5/5 strength throughout.  Tone is normal. Bulk is increased. Sensation- Intact to light touch bilaterally in all four extremities. Extinction absent to light touch to DSS.  Coordination: FTN intact bilaterally. No drift.  DTRs: 2+ throughout.  Cerebellar: No tremor, clonus. Gait: deferred.  CBC    Component Value Date/Time   WBC 9.6 08/14/2020 0802   RBC 4.72 08/14/2020 0802   HGB 14.7 08/14/2020 0802   HCT 44.1 08/14/2020 0802   PLT 253 08/14/2020 0802   MCV 93.4 08/14/2020 0802   MCH 31.1 08/14/2020 0802   MCHC 33.3 08/14/2020 0802   RDW 13.8 08/14/2020 0802   LYMPHSABS 1.1 08/14/2020 0802   MONOABS 0.6 08/14/2020 0802   EOSABS 0.2 08/14/2020 0802   BASOSABS 0.1 08/14/2020 0802   CMP     Component Value Date/Time   NA 134 (L) 08/14/2020 0802   K 4.6 08/14/2020 0802   CL 101 08/14/2020 0802   CO2 22 08/14/2020 0802   GLUCOSE 359 (H) 08/14/2020 0802   BUN 12 08/14/2020 0802   CREATININE 0.92 08/14/2020 0802   CALCIUM 8.7 (L) 08/14/2020 0802   PROT 6.4 (L) 08/14/2020 0802    ALBUMIN 3.4 (L) 08/14/2020 0802   AST 53 (H) 08/14/2020 0802   ALT 36 08/14/2020 0802   ALKPHOS 79 08/14/2020 0802   BILITOT 1.3 (H) 08/14/2020 0802   GFRNONAA >60 08/14/2020 0802   GFRAA >60 02/10/2019 1339   Imaging  CT head No acute intracranial abnormality or acute traumatic injury identified. Advanced cerebral  white matter disease, nonspecific but most commonly due to chronic small vessel disease.  MRI brain No acute finding. Extensive chronic small-vessel ischemic changes throughout the brain, progressive since 2012. In this patient with a history of diabetes, hypertension and hyperlipidemia, small-vessel disease is the likely cause. Demyelinating disease not excluded on the basis of imaging, but felt less likely given the above.  EEG Normal study without epileptiform discharges or seizures.   Assessment: 55 yo female with multiple medical problems who presents after witnessed seizure-like activity at home.  She has had multiple Neurological visits with nonspecific complaints (dizziness, diplopia, tremors, memory issues), but which may be partially related to her chronic medical conditions which are still not optimally controlled. Her TSH is very high. Hypothyroidism is not normally associated with seizures outside of myxedema crisis, however certainly may be contributing to the mild confusion she is experiencing.  Arrhythmia with post syncopal convulsion is also possible given her cardiovascular risk factors.  Discussed with patient and family that troponin elevation could either reflect primary cardiac issue that led to loss of blood flow to the brain temporarily and secondary convulsion, but could also be secondary to cardiac stress from seizure activity.  Reassuringly MRI does not show a clear seizure focus and EEG is negative for seizures.  Additionally neither the family nor the patient report any focality to the seizure.  A single episode of generalized tonic-clonic seizure has a  relatively low risk of recurrence and does not meet criteria for starting antiseizure medications.  However patient was cautioned against driving at this time for safety.  Should she have another event in the future, antiseizure medications should be considered at that time.  Impression: -seizure like activity, GTC vs. post syncopal convulsions vs functional.   Recommendations: -MRI brain and EEG were recommended by neurology and completed as above above in initial discussion with ED provider on the patient's presentation -Given findings and history as detailed above, no indication for initiation of antiseizure medications at this time -Recommend 30-day event monitor to assess for potential arrhythmias given the patient's risk factors, though defer to cardiology -Agree with echocardiogram ordered by cardiology -Counseled patient extensively on risk factor modification to reduce further chronic small vessel ischemic changes (continue to abstain from smoking, diet, exercise, medication adherence, and continued follow-up)  Standard seizure precautions: Per [facility] statutes, patients with seizures are not allowed to drive until  they have been seizure-free for six months. Use caution when using heavy equipment or power tools. Avoid working on ladders or at heights. Take showers instead of baths. Ensure the water temperature is not too high on the home water heater. Do not go swimming alone. When caring for infants or small children, sit down when holding, feeding, or changing them to minimize risk of injury to the child in the event you have a seizure.  To reduce risk of seizures, maintain good sleep hygiene avoid alcohol and illicit drug use, take all anti-seizure medications as prescribed.    Pt seen by Clance Boll, NP/Neuro and later by MD. Note/plan to be edited by MD as needed.  Pager: [phone]  Attending Neurologist's note:  I personally saw this patient, gathering history,  performing a full neurologic examination, reviewing relevant labs, personally reviewing relevant imaging including MRI brain, and formulated the assessment and plan, adding the note above for completeness and clarity to accurately reflect my thoughts   Lesleigh Noe MD-PhD [facility] [phone]

## 2020-08-14 NOTE — ED Notes (Signed)
Pt reports having covid 1 week ago

## 2020-08-14 NOTE — ED Notes (Signed)
Patient transported to CT 

## 2020-08-14 NOTE — H&P (Signed)
History and Physical    [name] MEQ:[id number] DOB: 08/09/1965 DOA: 08/14/2020  PCP: Medicine, [place] (Confirm with patient/family/NH records and if not entered, this has to be entered at [facility] point of entry) Patient coming from: Home  I have personally briefly reviewed patient's old medical records in [place]  Chief Complaint: Seizure.  HPI: [name] is a 55 y.o. female with medical history significant of HTN, HLD, IIDM, migraines, anxiety/depression, hypothyroid, morbid obesity, presented with new onset of seizure.  Woke up this morning with double vision, soon she had an witnessed seizure tonic-clonic, no tongue biting, palpation had urinary incontinence.  The patient woke up confused. Double vision gone and no weakness or numbness of the limbs.  Since this year, patient has had several episodes of double visions usually last 10 to 15 minutes, and her last episode was about 2 months ago.  Since April, she has been following with neurologist at [facility], MRI demonstrated white matter chronic microvascular ischemia versus demyelinating process.  Lumbar puncture also performed as outpatient, protein and IgG and oligoclonal bands within normal limits.  Patient admitted that she has not been compliant with her medications, and she stopped taking Divan about one month ago and she has been taking other of her medications "here and there" including Synthroid.  2 weeks ago, patient developed a dry cough, and shortness of breath, she took a home COVID test turned positive.  But she did not seek any medical otherwise for that.  Symptoms run for a week, cough has subsided but shortness of breath persistent, and related to activity. She also stopped smoking last week for increasing SOB. No chest pains.  ED Course: BP significantly elevated. MRI continues to demonstrate chronic microvascular changes and white matter. TSH>20, Trop 42>82. EKG showed chronic RBBB  and chronic ST changes. UDS negative.  Review of Systems: As per HPI otherwise 14 point review of systems negative.    Past Medical History:  Diagnosis Date   Anxiety and depression    Diabetes mellitus    GERD (gastroesophageal reflux disease)    HTN (hypertension)    Hyperlipidemia    Hypothyroidism    TIA (transient ischemic attack)     Past Surgical History:  Procedure Laterality Date   ABDOMINAL HYSTERECTOMY     Ankle surgery x 2     Exploratory Laparoscopy     PARTIAL HYSTERECTOMY       reports that she has been smoking cigarettes. She has a 40.00 pack-year smoking history. She has never used smokeless tobacco. She reports previous alcohol use. She reports that she does not use drugs.  Allergies  Allergen Reactions   Compazine  [Prochlorperazine] Anaphylaxis   Thiethylperazine Anaphylaxis   Vancomycin Rash and Itching    Patient developed localized itching, erythema, swelling.  Onset within 2 min of starting vanc IV.  Appears to be a true vanc allergy and not red-mans syndrome. Patient developed localized itching, erythema, swelling.  Onset within 2 min of starting vanc IV.  Appears to be a true vanc allergy and not red-mans syndrome.    Compazine    Lasix [Furosemide]     BREAKS OUT IN HIVES   Latex     Migraine    Family History  Problem Relation Age of Onset   Heart attack Father    Diabetes Father    Hyperlipidemia Father    Heart attack Paternal Uncle    Heart attack Maternal Grandfather  Hyperlipidemia Maternal Grandfather    Heart attack Paternal Grandfather    Hyperlipidemia Paternal Grandfather    Stroke Maternal Grandmother    Hyperlipidemia Maternal Grandmother    Stroke Paternal Grandmother    Hyperlipidemia Paternal Grandmother      Prior to Admission medications   Medication Sig Start Date End Date Taking? Authorizing Provider  acetaminophen (TYLENOL) 500 MG tablet Take 1,000 mg by mouth every 6 (six) hours as needed for headache or  mild pain.   Yes [provider]  albuterol (VENTOLIN HFA) 108 (90 Base) MCG/ACT inhaler Inhale 2 puffs into the lungs every 4 (four) hours as needed for wheezing or shortness of breath. 02/10/19  Yes Henderly, Britni A, PA-C  Aspirin-Salicylamide-Caffeine (BC HEADACHE PO) Take 1 packet by mouth daily as needed (headache/pain).   Yes [provider]  buPROPion (WELLBUTRIN XL) 300 MG 24 hr tablet Take 300 mg by mouth daily. 08/12/20  Yes [provider]  busPIRone (BUSPAR) 7.5 MG tablet Take 1 tablet (7.5 mg total) by mouth 2 (two) times daily. 05/15/19  Yes Abonza, Maritza, PA-C  fenofibrate (TRICOR) 145 MG tablet Take 1 tablet (145 mg total) by mouth daily. 05/15/19  Yes Abonza, Maritza, PA-C  JARDIANCE 10 MG TABS tablet Take 10 mg by mouth daily. 08/12/20  Yes [provider]  levothyroxine (SYNTHROID) 200 MCG tablet Take 1 tablet (200 mcg total) by mouth daily. 05/15/19  Yes Abonza, Maritza, PA-C  meclizine (ANTIVERT) 25 MG tablet Take 1 tablet (25 mg total) by mouth 3 (three) times daily as needed for dizziness. 05/02/20  Yes Nance Pear, MD  metFORMIN (GLUCOPHAGE-XR) 500 MG 24 hr tablet Take 1,000 mg by mouth in the morning and at bedtime.   Yes [provider]  naproxen sodium (ALEVE) 220 MG tablet Take 440 mg by mouth 2 (two) times daily as needed (pain).   Yes [provider]  rosuvastatin (CRESTOR) 40 MG tablet Take 1 tablet (40 mg total) by mouth daily. Patient taking differently: Take 40 mg by mouth every evening. 05/15/19  Yes Abonza, Herb Grays, PA-C  SYMBICORT 160-4.5 MCG/ACT inhaler Inhale 2 puffs into the lungs 2 (two) times daily. 02/05/19  Yes Hall-Potvin, [place], PA-C  valsartan-hydrochlorothiazide (DIOVAN-HCT) 160-25 MG tablet Take 1 tablet by mouth daily. **PATIENT NEEDS APT FOR FURTHER REFILLS** Patient taking differently: Take 1 tablet by mouth daily. 10/12/19  Yes Abonza, Maritza, PA-C  blood glucose meter kit and supplies Dispense  based on patient and insurance preference. Use up to four times daily as directed. (FOR ICD-10 E10.9, E11.9). 02/22/19   Esaw Grandchild, NP  ONETOUCH ULTRA test strip USE UP TO 4 TIMES A DAY AS DIRECTED 03/13/19   Mina Marble D, NP    Physical Exam: Vitals:   08/14/20 0902 08/14/20 0915 08/14/20 1017 08/14/20 1130  BP: (!) 174/114 (!) 165/104 (!) 166/114 (!) 162/94  Pulse: 98 96 94 96  Resp: (!) '22 17 20 ' (!) 24  Temp:      TempSrc:      SpO2: 96% 96% 96% 94%    Constitutional: NAD, calm, comfortable Vitals:   08/14/20 0902 08/14/20 0915 08/14/20 1017 08/14/20 1130  BP: (!) 174/114 (!) 165/104 (!) 166/114 (!) 162/94  Pulse: 98 96 94 96  Resp: (!) '22 17 20 ' (!) 24  Temp:      TempSrc:      SpO2: 96% 96% 96% 94%   Eyes: PERRL, lids and conjunctivae normal ENMT: Mucous membranes are moist. Posterior pharynx clear  of any exudate or lesions.Normal dentition.  Neck: normal, supple, no masses, no thyromegaly Respiratory: clear to auscultation bilaterally, no wheezing, fine crackles on B/L bases. Increasing respiratory effort. No accessory muscle use.  Cardiovascular: Regular rate and rhythm, no murmurs / rubs / gallops. 2+ extremity edema. 2+ pedal pulses. No carotid bruits.  Abdomen: no tenderness, no masses palpated. No hepatosplenomegaly. Bowel sounds positive.  Musculoskeletal: no clubbing / cyanosis. No joint deformity upper and lower extremities. Good ROM, no contractures. Normal muscle tone.  Skin: no rashes, lesions, ulcers. No induration Neurologic: CN 2-12 grossly intact. Sensation intact, DTR normal. Strength 5/5 in all 4.  Psychiatric: Normal judgment and insight. Alert and oriented x 3. Normal mood.    Labs on Admission: I have personally reviewed following labs and imaging studies  CBC: Recent Labs  Lab 08/14/20 0802  WBC 9.6  NEUTROABS 7.4  HGB 14.7  HCT 44.1  MCV 93.4  PLT 657   Basic Metabolic Panel: Recent Labs  Lab 08/14/20 0802  NA 134*  K 4.6  CL  101  CO2 22  GLUCOSE 359*  BUN 12  CREATININE 0.92  CALCIUM 8.7*  MG 1.6*   GFR: CrCl cannot be calculated (Unknown ideal weight.). Liver Function Tests: Recent Labs  Lab 08/14/20 0802  AST 53*  ALT 36  ALKPHOS 79  BILITOT 1.3*  PROT 6.4*  ALBUMIN 3.4*   No results for input(s): LIPASE, AMYLASE in the last 168 hours. No results for input(s): AMMONIA in the last 168 hours. Coagulation Profile: No results for input(s): INR, PROTIME in the last 168 hours. Cardiac Enzymes: No results for input(s): CKTOTAL, CKMB, CKMBINDEX, TROPONINI in the last 168 hours. BNP (last 3 results) No results for input(s): PROBNP in the last 8760 hours. HbA1C: No results for input(s): HGBA1C in the last 72 hours. CBG: No results for input(s): GLUCAP in the last 168 hours. Lipid Profile: No results for input(s): CHOL, HDL, LDLCALC, TRIG, CHOLHDL, LDLDIRECT in the last 72 hours. Thyroid Function Tests: Recent Labs    08/14/20 0802  TSH 37.580*   Anemia Panel: No results for input(s): VITAMINB12, FOLATE, FERRITIN, TIBC, IRON, RETICCTPCT in the last 72 hours. Urine analysis:    Component Value Date/Time   COLORURINE YELLOW 08/14/2020 0719   APPEARANCEUR HAZY (A) 08/14/2020 0719   LABSPEC 1.018 08/14/2020 0719   PHURINE 5.0 08/14/2020 0719   GLUCOSEU >=500 (A) 08/14/2020 0719   HGBUR NEGATIVE 08/14/2020 0719   BILIRUBINUR NEGATIVE 08/14/2020 0719   KETONESUR NEGATIVE 08/14/2020 0719   PROTEINUR 100 (A) 08/14/2020 0719   NITRITE NEGATIVE 08/14/2020 0719   LEUKOCYTESUR NEGATIVE 08/14/2020 0719    Radiological Exams on Admission: CT Head Wo Contrast  Result Date: 08/14/2020 CLINICAL DATA:  55 year old female with witnessed seizure. Altered mental status. Fall. EXAM: CT HEAD WITHOUT CONTRAST TECHNIQUE: Contiguous axial images were obtained from the base of the skull through the vertex without intravenous contrast. COMPARISON:  Brain MRI 10/17/2010.  Head CT 10/15/2010. FINDINGS: Brain:  Little cerebral volume loss since 2012. No midline shift, ventriculomegaly, mass effect, evidence of mass lesion, intracranial hemorrhage or evidence of cortically based acute infarction. Advanced bilateral cerebral white matter hypodensity in a patchy, scattered configuration is fairly symmetric. Deep gray nuclei appear relatively spared. No cortical encephalomalacia. Vascular: Mild Calcified atherosclerosis at the skull base. No suspicious intracranial vascular hyperdensity. Skull: Stable and intact. Sinuses/Orbits: Visualized paranasal sinuses and mastoids are stable and well aerated. Other: No orbit or scalp soft tissue injury identified. IMPRESSION: 1.  No acute intracranial abnormality or acute traumatic injury identified. 2. Advanced cerebral white matter disease, nonspecific but most commonly due to chronic small vessel disease. Electronically Signed   By: Genevie Ann M.D.   On: 08/14/2020 08:21   DG Chest Port 1 View  Result Date: 08/14/2020 CLINICAL DATA:  Seizure, altered mental status EXAM: PORTABLE CHEST 1 VIEW COMPARISON:  02/10/2019 FINDINGS: Heart and mediastinal contours are within normal limits. No focal opacities or effusions. No acute bony abnormality. IMPRESSION: No active disease. Electronically Signed   By: Rolm Baptise M.D.   On: 08/14/2020 08:31    EKG: Independently reviewed. RBBB, chronic non-specific ST-T changes on III, aVF and frontal leads.  Assessment/Plan Active Problems:   AMS (altered mental status)  (please populate well all problems here in Problem List. (For example, if patient is on BP meds at home and you resume or decide to hold them, it is a problem that needs to be her. Same for CAD, COPD, HLD and so on)  New onset seizure -Workup including EEG and brain MRI pending -PRN Ativan -Seizure precautions  Acute metabolic encephalopathy -Unknown etiology.  Appeared that the patient had several episodes this year before today's seizure episode.  Patient has not been  compliant with all medications, including thyroid medication and psychiatry medications, which raised concern about withdrawal from any of these medications.  Positive troponins -No chest pain -Suspect demanding ischemia with significant elevation of blood pressure and earlier seizure episode.  Recycle troponins x2.  Echocardiogram. -Check CK level.  HTN uncontrolled -Resume home BP meds, HCTZ and ARB -Add PRN Labetalol  Peripheral edema -DVT study -One dose of Lasix.  Acute diastolic CHF decompensation -Likely secondary to uncontrolled hypertension.  With symptoms and signs of fluid overload on physical exam and chest x-ray.  1 dose of Lasix given. -Home BP meds and as needed labetalol. -Echocardiogram and x-ray in AM.  IIDM with hyperglycemia -Continue metformin -Add sliding scale.  Hypothyroidism -Significant elevation of TSH indicating noncompliant with Synthroid. -Appears that patient takes 200 mcg Synthroid at home, restart Synthroid at 100 mg daily, stepwise increase every 2 to 3 weeks.  Recheck TSH in 4 to 6 weeks.  Anxiety depression -Continue SSRI.  Cigarette smoke -Patient refused nicotine patch.  DVT prophylaxis: Lovenox Code Status: Full Code Family Communication: Daughter at bedside Disposition Plan: Expect more than 2 midnight hospital stay, for workup and treatment of AMS and seizure Consults called: Neurology Admission status: Tele admit   Lequita Halt MD [facility] Pager [phone]  08/14/2020, 12:44 PM

## 2020-08-14 NOTE — Consult Note (Signed)
Cardiology Consultation:  Patient ID: WALDINE ZENZ MRN: [id number]; DOB: 22-Apr-1965  Admit date: 08/14/2020 Date of Consult: 08/14/2020  Primary Care Provider: Medicine, [facility] Primary Cardiologist: None  Primary Electrophysiologist:  None   Patient Profile:  [name] is a 55 y.o. female with a hx of COPD, tobacco abuse, hypertension, diabetes, chronic microvascular white matter changes, memory impairment, anxiety/depression, hypothyroidism who is being seen today for the evaluation of elevated troponin at the request of Wynetta Fines, MD.  History of Present Illness:  Ms. Swiney was admitted this morning after a seizure like episode.  She is unable to provide most of the history as she does not remember this but her daughter does.  Apparently around 6:30 AM the patient awoke and did not feel well.  She just felt weak and fatigued.  She also felt shaky and just not well.  She called several of her family members and informed them of this.  Apparently her aunt who is not in the room during my interview found her and she was extremely shaky.  Apparently she sat down and blacked out.  There is seizure-like activity reported which included convulsions.  Symptoms lasted several minutes and then resolved.  She was then brought to the emergency room.  She denies any chest pain or shortness of breath prior to the episodes.  She is short of breath all the time due to her COPD.  She apparently has not been taking her blood pressure medications or Synthroid medications.  She was hypertensive on admission.  Medications have been restarted.  She was also tachycardic.  Lab work notable for hyperglycemia with glucose of 359.  Creatinine 0.92.  She did have an elevated T bili.  WBC 9.6.  Hemoglobin 14.7.  Platelets stable.  Chest x-ray clear.  CT head showed no acute intracranial abnormality.  She does have advanced cerebral white matter disease.  Chronic small vessel disease noted as  well.  Brain MRI without acute findings.  There was extensive small vessel disease noted.  Her EEG is within normal limits.  Initial troponin was 42.  This was 82 on repeat.  Roughly 4 to 5 hours it was checked and 167.  She had another value around 4 PM that has trended up to 204.  She tells me she has no chest pain or trouble breathing.  She is slowly starting to feel better.  TSH noted to be 37.5.  She apparently stopped taking her medications.  EKG shows sinus tachycardia with right bundle branch block.  She does have right axis deviation noted.  No ST elevation, no acute ischemic changes.  I did review her EKG from 05/03/2020 and this shows no change.  Heart Pathway Score:       Past Medical History: Past Medical History:  Diagnosis Date   Anxiety and depression    Diabetes mellitus    GERD (gastroesophageal reflux disease)    HTN (hypertension)    Hyperlipidemia    Hypothyroidism    TIA (transient ischemic attack)     Past Surgical History: Past Surgical History:  Procedure Laterality Date   ABDOMINAL HYSTERECTOMY     Ankle surgery x 2     Exploratory Laparoscopy     PARTIAL HYSTERECTOMY       Home Medications:  Prior to Admission medications   Medication Sig Start Date End Date Taking? Authorizing Provider  acetaminophen (TYLENOL) 500 MG tablet Take 1,000 mg by mouth every 6 (six) hours as needed  for headache or mild pain.   Yes [provider]  albuterol (VENTOLIN HFA) 108 (90 Base) MCG/ACT inhaler Inhale 2 puffs into the lungs every 4 (four) hours as needed for wheezing or shortness of breath. 02/10/19  Yes Henderly, Britni A, PA-C  Aspirin-Salicylamide-Caffeine (BC HEADACHE PO) Take 1 packet by mouth daily as needed (headache/pain).   Yes [provider]  buPROPion (WELLBUTRIN XL) 300 MG 24 hr tablet Take 300 mg by mouth daily. 08/12/20  Yes [provider]  busPIRone (BUSPAR) 7.5 MG tablet Take 1 tablet (7.5 mg total) by mouth 2 (two) times daily.  05/15/19  Yes Abonza, Maritza, PA-C  fenofibrate (TRICOR) 145 MG tablet Take 1 tablet (145 mg total) by mouth daily. 05/15/19  Yes Abonza, Maritza, PA-C  JARDIANCE 10 MG TABS tablet Take 10 mg by mouth daily. 08/12/20  Yes [provider]  levothyroxine (SYNTHROID) 200 MCG tablet Take 1 tablet (200 mcg total) by mouth daily. 05/15/19  Yes Abonza, Maritza, PA-C  meclizine (ANTIVERT) 25 MG tablet Take 1 tablet (25 mg total) by mouth 3 (three) times daily as needed for dizziness. 05/02/20  Yes Nance Pear, MD  metFORMIN (GLUCOPHAGE-XR) 500 MG 24 hr tablet Take 1,000 mg by mouth in the morning and at bedtime.   Yes [provider]  naproxen sodium (ALEVE) 220 MG tablet Take 440 mg by mouth 2 (two) times daily as needed (pain).   Yes [provider]  rosuvastatin (CRESTOR) 40 MG tablet Take 1 tablet (40 mg total) by mouth daily. Patient taking differently: Take 40 mg by mouth every evening. 05/15/19  Yes Abonza, Herb Grays, PA-C  SYMBICORT 160-4.5 MCG/ACT inhaler Inhale 2 puffs into the lungs 2 (two) times daily. 02/05/19  Yes Hall-Potvin, [place], PA-C  valsartan-hydrochlorothiazide (DIOVAN-HCT) 160-25 MG tablet Take 1 tablet by mouth daily. **PATIENT NEEDS APT FOR FURTHER REFILLS** Patient taking differently: Take 1 tablet by mouth daily. 10/12/19  Yes Abonza, Maritza, PA-C  blood glucose meter kit and supplies Dispense based on patient and insurance preference. Use up to four times daily as directed. (FOR ICD-10 E10.9, E11.9). 02/22/19   Danford, Berna Spare, NP  ONETOUCH ULTRA test strip USE UP TO 4 TIMES A DAY AS DIRECTED 03/13/19   Mina Marble D, NP    Inpatient Medications: Scheduled Meds:  aspirin  325 mg Oral Daily   [START ON 08/15/2020] buPROPion  300 mg Oral Daily   busPIRone  7.5 mg Oral BID   empagliflozin  10 mg Oral Daily   enoxaparin (LOVENOX) injection  40 mg Subcutaneous Q24H   [START ON 08/15/2020] fenofibrate  160 mg Oral Daily   [START ON 08/15/2020] fluticasone  furoate-vilanterol  1 puff Inhalation Daily   hydrochlorothiazide  25 mg Oral Daily   insulin aspart  0-15 Units Subcutaneous TID WC   irbesartan  150 mg Oral Daily   [START ON 08/15/2020] levothyroxine  100 mcg Oral Q0600   magnesium oxide  800 mg Oral BID   [START ON 08/15/2020] metFORMIN  1,000 mg Oral Q breakfast   rosuvastatin  40 mg Oral QPM   Continuous Infusions:  PRN Meds: acetaminophen, albuterol, aspirin-acetaminophen-caffeine, labetalol, LORazepam, meclizine, naproxen, ondansetron **OR** ondansetron (ZOFRAN) IV  Allergies:    Allergies  Allergen Reactions   Compazine  [Prochlorperazine] Anaphylaxis   Thiethylperazine Anaphylaxis   Vancomycin Rash and Itching    Patient developed localized itching, erythema, swelling.  Onset within 2 min of starting vanc IV.  Appears to be a true vanc allergy and  not red-mans syndrome. Patient developed localized itching, erythema, swelling.  Onset within 2 min of starting vanc IV.  Appears to be a true vanc allergy and not red-mans syndrome.    Compazine    Lasix [Furosemide]     BREAKS OUT IN HIVES   Latex     Migraine    Social History:   Social History   Socioeconomic History   Marital status: Legally Separated    Spouse name: Not on file   Number of children: 1   Years of education: Not on file   Highest education level: Not on file  Occupational History    Employer: [employer]  Tobacco Use   Smoking status: Every Day    Packs/day: 1.00    Years: 40.00    Pack years: 40.00    Types: Cigarettes   Smokeless tobacco: Never  Vaping Use   Vaping Use: Never used  Substance and Sexual Activity   Alcohol use: Not Currently   Drug use: No   Sexual activity: Not Currently  Other Topics Concern   Not on file  Social History Narrative   Not on file   Social Determinants of Health   Financial Resource Strain: Not on file  Food Insecurity: Not on file  Transportation Needs: Not on file  Physical Activity:  Not on file  Stress: Not on file  Social Connections: Not on file  Intimate Partner Violence: Not on file     Family History:    Family History  Problem Relation Age of Onset   Heart attack Father    Diabetes Father    Hyperlipidemia Father    Heart attack Paternal Uncle    Heart attack Maternal Grandfather    Hyperlipidemia Maternal Grandfather    Heart attack Paternal Grandfather    Hyperlipidemia Paternal Grandfather    Stroke Maternal Grandmother    Hyperlipidemia Maternal Grandmother    Stroke Paternal Grandmother    Hyperlipidemia Paternal Grandmother      ROS:  All other ROS reviewed and negative. Pertinent positives noted in the HPI.     Physical Exam/Data:   Vitals:   08/14/20 1017 08/14/20 1130 08/14/20 1558 08/14/20 1621  BP: (!) 166/114 (!) 162/94 (!) 150/93   Pulse: 94 96 87   Resp: 20 (!) 24 20   Temp:   97.6 F (36.4 C)   TempSrc:   Oral   SpO2: 96% 94% 95%   Weight:    96.8 kg  Height:    _0  (1.6 m)   No intake or output data in the 24 hours ending 08/14/20 1914  Last 3 Weights 08/14/2020 05/02/2020 04/05/2019  Weight (lbs) 213 lb 6.5 oz 210 lb 197 lb 9.6 oz  Weight (kg) 96.8 kg 95.255 kg 89.631 kg    Body mass index is 37.8 kg/m.   General: Well nourished, well developed, in no acute distress Head: Atraumatic, normal size  Eyes: PEERLA, EOMI  Neck: Supple, no JVD Endocrine: No thryomegaly Cardiac: Normal S1, S2; RRR; no murmurs, rubs, or gallops Lungs: Diminished breath sounds Abd: Soft, nontender, no hepatomegaly  Ext: Trace edema Musculoskeletal: No deformities, BUE and BLE strength normal and equal Skin: Warm and dry, no rashes   Neuro: Alert and oriented to person, place, time, and situation, CNII-XII grossly intact, no focal deficits  Psych: Normal mood and affect   EKG:  The EKG was personally reviewed and demonstrates: Sinus tachycardia heart rate 118, right bundle branch block, no acute ischemic  changes, no change from  prior Telemetry:  Telemetry was personally reviewed and demonstrates: Not on telemetry  Relevant CV Studies: Echo pending  Laboratory Data: High Sensitivity Troponin:   Recent Labs  Lab 08/14/20 0802 08/14/20 0950 08/14/20 1348 08/14/20 1613  TROPONINIHS 42* 82* 167* 204*     Cardiac EnzymesNo results for input(s): TROPONINI in the last 168 hours. No results for input(s): TROPIPOC in the last 168 hours.  Chemistry Recent Labs  Lab 08/14/20 0802  NA 134*  K 4.6  CL 101  CO2 22  GLUCOSE 359*  BUN 12  CREATININE 0.92  CALCIUM 8.7*  GFRNONAA >60  ANIONGAP 11    Recent Labs  Lab 08/14/20 0802  PROT 6.4*  ALBUMIN 3.4*  AST 53*  ALT 36  ALKPHOS 79  BILITOT 1.3*   Hematology Recent Labs  Lab 08/14/20 0802  WBC 9.6  RBC 4.72  HGB 14.7  HCT 44.1  MCV 93.4  MCH 31.1  MCHC 33.3  RDW 13.8  PLT 253   BNPNo results for input(s): BNP, PROBNP in the last 168 hours.  DDimer No results for input(s): DDIMER in the last 168 hours.  Radiology/Studies:  CT Head Wo Contrast  Result Date: 08/14/2020 CLINICAL DATA:  55 year old female with witnessed seizure. Altered mental status. Fall. EXAM: CT HEAD WITHOUT CONTRAST TECHNIQUE: Contiguous axial images were obtained from the base of the skull through the vertex without intravenous contrast. COMPARISON:  Brain MRI 10/17/2010.  Head CT 10/15/2010. FINDINGS: Brain: Little cerebral volume loss since 2012. No midline shift, ventriculomegaly, mass effect, evidence of mass lesion, intracranial hemorrhage or evidence of cortically based acute infarction. Advanced bilateral cerebral white matter hypodensity in a patchy, scattered configuration is fairly symmetric. Deep gray nuclei appear relatively spared. No cortical encephalomalacia. Vascular: Mild Calcified atherosclerosis at the skull base. No suspicious intracranial vascular hyperdensity. Skull: Stable and intact. Sinuses/Orbits: Visualized paranasal sinuses and mastoids are stable and  well aerated. Other: No orbit or scalp soft tissue injury identified. IMPRESSION: 1. No acute intracranial abnormality or acute traumatic injury identified. 2. Advanced cerebral white matter disease, nonspecific but most commonly due to chronic small vessel disease. Electronically Signed   By: Genevie Ann M.D.   On: 08/14/2020 08:21   MR Brain W and Wo Contrast  Result Date: 08/14/2020 CLINICAL DATA:  Witnessed seizure.  Altered mental status. EXAM: MRI HEAD WITHOUT AND WITH CONTRAST TECHNIQUE: Multiplanar, multiecho pulse sequences of the brain and surrounding structures were obtained without and with intravenous contrast. CONTRAST:  14m GADAVIST GADOBUTROL 1 MMOL/ML IV SOLN COMPARISON:  Head CT same day.  MRI 10/17/2010. FINDINGS: Brain: Diffusion imaging does not show any acute or subacute infarction. No focal abnormality affects the brainstem. Few old small vessel insults within the cerebellum. Cerebral hemispheres show abnormal T2 and FLAIR signal throughout the white matter, progressive since 20/12, most consistent with chronic small vessel disease. Demyelinating disease not excluded. No mass, hemorrhage, hydrocephalus or extra-axial collection. After contrast administration, no abnormal enhancement occurs. Vascular: Major vessels at the base of the brain show flow. Skull and upper cervical spine: Negative Sinuses/Orbits: Clear/normal Other: None IMPRESSION: No acute finding. Extensive chronic small-vessel ischemic changes throughout the brain, progressive since 2012. In this patient with a history of diabetes, hypertension and hyperlipidemia, small-vessel disease is the likely cause. Demyelinating disease not excluded on the basis of imaging, but felt less likely given the above. Electronically Signed   By: MNelson ChimesM.D.   On: 08/14/2020 13:06   DG  Chest Port 1 View  Result Date: 08/14/2020 CLINICAL DATA:  Seizure, altered mental status EXAM: PORTABLE CHEST 1 VIEW COMPARISON:  02/10/2019 FINDINGS:  Heart and mediastinal contours are within normal limits. No focal opacities or effusions. No acute bony abnormality. IMPRESSION: No active disease. Electronically Signed   By: Rolm Baptise M.D.   On: 08/14/2020 08:31   EEG adult  Result Date: 08/14/2020 Lora Havens, MD     08/14/2020  6:21 PM Patient Name: SALIMAH MARTINOVICH MRN: [id number] Epilepsy Attending: Lora Havens Referring Physician/Provider: Dr Noemi Chapel Date: 08/14/2020 Duration: 24.26 mins Patient history: 55yo M with new onset seizure. EEG to evaluate for seizure. Level of alertness: Awake, asleep AEDs during EEG study: None Technical aspects: This EEG study was done with scalp electrodes positioned according to the 10-20 International system of electrode placement. Electrical activity was acquired at a sampling rate of _0  and reviewed with a high frequency filter of _1  and a low frequency filter of _2 . EEG data were recorded continuously and digitally stored. Description: The posterior dominant rhythm consists of 8-9 Hz activity of moderate voltage (25-35 uV) seen predominantly in posterior head regions, symmetric and reactive to eye opening and eye closing. Sleep was characterized by vertex waves, sleep spindles (12 to 14 Hz), maximal frontocentral region. Hyperventilation and photic stimulation were not performed.   IMPRESSION: This study is within normal limits. No seizures or epileptiform discharges were seen throughout the recording. Priyanka O Yadav    Assessment and Plan:   1.  Elevated troponin/syncope -She presents after a self-reported seizure.  She apparently did not feel well this morning.   -She then had a convulsive episode per reports from family.  Lab work remarkable for elevated TSH of 37.  She was also hypertensive on admission.   -She has not been taking her medications.   -She has no prior history of myocardial infarction.  She does have extensive risk factors including diabetes and hypertension and  medication noncompliance it appears. -She has had strokes as her MRI shows chronic microvascular changes.  There is no acute stroke.   -Her blood gas shows 7.3 6/52/55.  She was tachycardic on arrival.  EKG shows right bundle branch block with no changes from prior.  She has no acute ischemic changes.  She reports no chest pain or trouble breathing prior to the episode.  It took her several hours to get back to normal.  The episode does sound like a seizure however EEG is normal.   -No ingestion per her report.  UDS is negative.  -Really unclear what caused this episode.  Would defer to neurology to definitively rule out a seizure episode as it sure does sound like a seizure. -Regarding the troponin elevation she describes no chest pain.  Her troponin is minimally elevated over several hours since the initial episode at 630 this morning.  Her EKG has no dynamic ischemic changes.  It is unchanged from prior.   -To me this is not an acute coronary syndrome nor does this explain her syncopal episode.  This could simply be demand in the setting of seizure versus hypertension.  I am a little concerned about her tachycardia as she may have had a pulmonary embolism this could explain her passing out spell and minimally elevated troponin.  I have recommended to order a CT PE study tonight we will obtain that.  We will obtain an echocardiogram in the morning as well.  She may ultimately end up needing  a stress test just to make sure she has no underlying CAD however this appears to be secondary to her initial event that brought her to the hospital.  I do not believe a cardiac etiology explains the seizure-like activity she had.  Her cardiovascular examination is very benign and she has no murmurs. -For now we will continue aspirin.  She is on aspirin 325 daily.  She is on a statin.  No need for beta-blocker at this time.  No need for anticoagulation.  We will follow-up the PE study as well as echocardiogram tomorrow.   We will get a good idea if she has extensive coronary calcifications.  She may be a good candidate for coronary CTA if her PE study is negative.  This will all depend on her level of coronary calcium seen on the study.  For questions or updates, please contact [place] Please consult [email or web address] for contact info under   Signed, [place] T. Audie Box, MD, [place]  08/14/2020 7:14 PM

## 2020-08-14 NOTE — Progress Notes (Signed)
Patient is unavailable for routine EEG at this time.  She is being transferred to room 5M10.  Will re-attempt EEG upon her arrival in room 5M10.

## 2020-08-14 NOTE — Procedures (Signed)
Patient Name: [name]  MRN: [id number]  Epilepsy Attending: Charlsie Quest  Referring Physician/Provider: Dr Eber Hong Date: 08/14/2020 Duration: 24.26 mins  Patient history: 55yo M with new onset seizure. EEG to evaluate for seizure.   Level of alertness: Awake, asleep  AEDs during EEG study: None  Technical aspects: This EEG study was done with scalp electrodes positioned according to the 10-20 International system of electrode placement. Electrical activity was acquired at a sampling rate of 500Hz  and reviewed with a high frequency filter of 70Hz  and a low frequency filter of 1Hz . EEG data were recorded continuously and digitally stored.   Description: The posterior dominant rhythm consists of 8-9 Hz activity of moderate voltage (25-35 uV) seen predominantly in posterior head regions, symmetric and reactive to eye opening and eye closing. Sleep was characterized by vertex waves, sleep spindles (12 to 14 Hz), maximal frontocentral region. Hyperventilation and photic stimulation were not performed.     IMPRESSION: This study is within normal limits. No seizures or epileptiform discharges were seen throughout the recording.  Yi Falletta 

## 2020-08-14 NOTE — Progress Notes (Signed)
Trop level on the rise. Patient has no chest pains. Repeat EKG pending.  D/W on call cardiology Dr. Scharlene Gloss, initial impression is seizure caused trop elevation. Hold off ACS meds, Echo pending.

## 2020-08-14 NOTE — Progress Notes (Signed)
NEW ADMISSION NOTE New Admission Note:   Arrival Method: stretcher Mental Orientation: A&o X 4 Telemetry: N/A Assessment: Completed Skin: intact IV: LFA   Pain: 0/10 Tubes:NONE Safety Measures: Safety Fall Prevention Plan has been given, discussed and signed Admission: Completed [facility] Orientation: Patient has been orientated to the room, unit and staff.  Family: daughter at bedside  Orders have been reviewed and implemented. Will continue to monitor the patient. Call light has been placed within reach and bed alarm has been activated.   Ronzell Laban S Raaga Maeder, RN

## 2020-08-14 NOTE — ED Triage Notes (Signed)
Pt arrived via [facility] from home for cc of AMS and witnessed seizure. EMS report family on scene received call from pt "talking out of her mind" upon arrival at her house family witnessed ~20 sec grand mal activity, fall information not provided. EMS report pt was postictal on arrival, level of consciousness improved enroute. Pt agitated during transport repeatedly expressing concerns that she is going to die.   Vitals HR 92 RR 20 BP 186/110 SPO2 92% RA, 95% 4LPM CBG 340

## 2020-08-14 NOTE — ED Provider Notes (Signed)
[facility] EMERGENCY DEPARTMENT Provider Note   CSN: [id number] Arrival date & time: 08/14/20  4098     History Chief Complaint  Patient presents with   Seizures   Altered Mental Status    [name] is a 55 y.o. female.   Seizures Altered Mental Status Associated symptoms: seizures    This patient is a 55 year old female who is a known diabetic has acid reflux hypertension hyperlipidemia.  According to the medical record and the telemedicine visit which she had on June 28 she had symptoms that started on June 20, headache chest congestion and a cough, and tested positive for COVID on June 27.  No other information is available at this time.  According to the paramedics the patient was found to be altered this morning by her sister, when she got to the house the patient had about 20 seconds of tonic-clonic activity after which she had a deep obtunded state and appeared to be postictal to the paramedics.  In route to the hospital the patient did have some return of her mental status though now she is back at her combative state stating that she feels like she has Huntington's disease and we just need to run the test because she is going to die.  The patient is tachycardic, she had urinary incontinence, she is not her normal mental status.  She is able to tell me her name or birthdate her address and where she is at this time.  She is refusing to give me any other information just dating over and over that she is going to die and wants to see her sister and her daughter.  Level 5 caveat applies secondary to altered mental status  Past Medical History:  Diagnosis Date   Anxiety and depression    Diabetes mellitus    GERD (gastroesophageal reflux disease)    HTN (hypertension)    Hyperlipidemia    Hypothyroidism    TIA (transient ischemic attack)     Patient Active Problem List   Diagnosis Date Noted   Tobacco abuse counseling 04/09/2019   Poor eating  habits 04/09/2019   Healthcare maintenance 02/22/2019   Uncontrolled diabetes mellitus ([place]) 07/03/2017   Hypertension associated with diabetes ([place]) 07/03/2017   Depressive disorder 09/08/2016   Chronic obstructive lung disease ([place]) 09/08/2016   Hyperlipidemia 09/08/2016   Morbid obesity ([place]) 08/20/2016   Hyperlipidemia associated with type 2 diabetes mellitus ([place]) 08/20/2016   History of TIA (transient ischemic attack) 05/05/2011   Hypothyroid 08/08/2010   Tobacco abuse 07/10/2010    Past Surgical History:  Procedure Laterality Date   ABDOMINAL HYSTERECTOMY     Ankle surgery x 2     Exploratory Laparoscopy     PARTIAL HYSTERECTOMY       OB History   No obstetric history on file.     Family History  Problem Relation Age of Onset   Heart attack Father    Diabetes Father    Hyperlipidemia Father    Heart attack Paternal Uncle    Heart attack Maternal Grandfather    Hyperlipidemia Maternal Grandfather    Heart attack Paternal Grandfather    Hyperlipidemia Paternal Grandfather    Stroke Maternal Grandmother    Hyperlipidemia Maternal Grandmother    Stroke Paternal Grandmother    Hyperlipidemia Paternal Grandmother     Social History   Tobacco Use   Smoking status: Every Day    Packs/day: 1.00    Years: 40.00  Pack years: 40.00    Types: Cigarettes   Smokeless tobacco: Never  Vaping Use   Vaping Use: Never used  Substance Use Topics   Alcohol use: Not Currently   Drug use: No    Home Medications Prior to Admission medications   Medication Sig Start Date End Date Taking? Authorizing Provider  acetaminophen (TYLENOL) 500 MG tablet Take 1,000 mg by mouth every 6 (six) hours as needed for headache or mild pain.   Yes [provider]  albuterol (VENTOLIN HFA) 108 (90 Base) MCG/ACT inhaler Inhale 2 puffs into the lungs every 4 (four) hours as needed for wheezing or shortness of breath. 02/10/19  Yes Henderly, Britni A, PA-C   Aspirin-Salicylamide-Caffeine (BC HEADACHE PO) Take 1 packet by mouth daily as needed (headache/pain).   Yes [provider]  buPROPion (WELLBUTRIN XL) 300 MG 24 hr tablet Take 300 mg by mouth daily. 08/12/20  Yes [provider]  busPIRone (BUSPAR) 7.5 MG tablet Take 1 tablet (7.5 mg total) by mouth 2 (two) times daily. 05/15/19  Yes Abonza, Maritza, PA-C  fenofibrate (TRICOR) 145 MG tablet Take 1 tablet (145 mg total) by mouth daily. 05/15/19  Yes Abonza, Maritza, PA-C  JARDIANCE 10 MG TABS tablet Take 10 mg by mouth daily. 08/12/20  Yes [provider]  levothyroxine (SYNTHROID) 200 MCG tablet Take 1 tablet (200 mcg total) by mouth daily. 05/15/19  Yes Abonza, Maritza, PA-C  meclizine (ANTIVERT) 25 MG tablet Take 1 tablet (25 mg total) by mouth 3 (three) times daily as needed for dizziness. 05/02/20  Yes Nance Pear, MD  metFORMIN (GLUCOPHAGE-XR) 500 MG 24 hr tablet Take 1,000 mg by mouth in the morning and at bedtime.   Yes [provider]  naproxen sodium (ALEVE) 220 MG tablet Take 440 mg by mouth 2 (two) times daily as needed (pain).   Yes [provider]  rosuvastatin (CRESTOR) 40 MG tablet Take 1 tablet (40 mg total) by mouth daily. Patient taking differently: Take 40 mg by mouth every evening. 05/15/19  Yes Abonza, Herb Grays, PA-C  SYMBICORT 160-4.5 MCG/ACT inhaler Inhale 2 puffs into the lungs 2 (two) times daily. 02/05/19  Yes Hall-Potvin, [place], PA-C  valsartan-hydrochlorothiazide (DIOVAN-HCT) 160-25 MG tablet Take 1 tablet by mouth daily. **PATIENT NEEDS APT FOR FURTHER REFILLS** Patient taking differently: Take 1 tablet by mouth daily. 10/12/19  Yes Abonza, Maritza, PA-C  blood glucose meter kit and supplies Dispense based on patient and insurance preference. Use up to four times daily as directed. (FOR ICD-10 E10.9, E11.9). 02/22/19   Danford, Berna Spare, NP  ONETOUCH ULTRA test strip USE UP TO 4 TIMES A DAY AS DIRECTED 03/13/19   Danford, Valetta Fuller D, NP     Allergies    Compazine  [prochlorperazine], Thiethylperazine, Vancomycin, Compazine, Lasix [furosemide], and Latex  Review of Systems   Review of Systems  Unable to perform ROS: Mental status change  Neurological:  Positive for seizures.   Physical Exam Updated Vital Signs BP (!) 162/94   Pulse 96   Temp 98.6 F (37 C) (Oral)   Resp (!) 24   SpO2 94%   Physical Exam Vitals and nursing note reviewed.  Constitutional:      General: She is not in acute distress.    Appearance: She is well-developed.     Comments: Agitated  HENT:     Head: Normocephalic and atraumatic.     Nose: Nose normal.     Mouth/Throat:     Pharynx: No oropharyngeal exudate.  Eyes:     General: No scleral icterus.       Right eye: No discharge.        Left eye: No discharge.     Conjunctiva/sclera: Conjunctivae normal.     Pupils: Pupils are equal, round, and reactive to light.  Neck:     Thyroid: No thyromegaly.     Vascular: No JVD.  Cardiovascular:     Rate and Rhythm: Regular rhythm. Tachycardia present.     Heart sounds: Normal heart sounds. No murmur heard.   No friction rub. No gallop.  Pulmonary:     Effort: Pulmonary effort is normal. No respiratory distress.     Breath sounds: Normal breath sounds. No wheezing or rales.  Abdominal:     General: Bowel sounds are normal. There is no distension.     Palpations: Abdomen is soft. There is no mass.     Tenderness: There is no abdominal tenderness.  Musculoskeletal:        General: No tenderness. Normal range of motion.     Cervical back: Normal range of motion and neck supple.  Lymphadenopathy:     Cervical: No cervical adenopathy.  Skin:    General: Skin is warm and dry.     Findings: No erythema or rash.  Neurological:     Mental Status: She is alert.     Coordination: Coordination normal.     Comments: There is no facial droop, the patient is able to speak in complete sentences, she speaks clearly without slurring her words  has no word finding difficulties and is able to move all 4 extremities with normal strength including straight leg raise and grips bilaterally.  Cranial nerves III through XII seem to be normal.  She is altered  Psychiatric:        Behavior: Behavior normal.     Comments: Appears anxious and agitated    ED Results / Procedures / Treatments   Labs (all labs ordered are listed, but only abnormal results are displayed) Labs Reviewed  COMPREHENSIVE METABOLIC PANEL - Abnormal; Notable for the following components:      Result Value   Sodium 134 (*)    Glucose, Bld 359 (*)    Calcium 8.7 (*)    Total Protein 6.4 (*)    Albumin 3.4 (*)    AST 53 (*)    Total Bilirubin 1.3 (*)    All other components within normal limits  CBC WITH DIFFERENTIAL/PLATELET - Abnormal; Notable for the following components:   Abs Immature Granulocytes 0.08 (*)    All other components within normal limits  TSH - Abnormal; Notable for the following components:   TSH 37.580 (*)    All other components within normal limits  URINALYSIS, ROUTINE W REFLEX MICROSCOPIC - Abnormal; Notable for the following components:   APPearance HAZY (*)    Glucose, UA >=500 (*)    Protein, ur 100 (*)    All other components within normal limits  MAGNESIUM - Abnormal; Notable for the following components:   Magnesium 1.6 (*)    All other components within normal limits  TROPONIN I (HIGH SENSITIVITY) - Abnormal; Notable for the following components:   Troponin I (High Sensitivity) 42 (*)    All other components within normal limits  TROPONIN I (HIGH SENSITIVITY) - Abnormal; Notable for the following components:   Troponin I (High Sensitivity) 82 (*)    All other components within normal limits  URINE CULTURE  RAPID URINE DRUG SCREEN,  HOSP PERFORMED  ETHANOL  CK    EKG EKG Interpretation  Date/Time:  Wednesday August 14 2020 06:56:48 EDT Ventricular Rate:  118 PR Interval:  131 QRS Duration: 149 QT Interval:  367 QTC  Calculation: 515 R Axis:   97 Text Interpretation: Sinus tachycardia RBBB and LPFB Probable anteroseptal infarct, old Since last tracing rate faster Confirmed by Noemi Chapel [phone]) on 08/14/2020 7:20:31 AM  Radiology CT Head Wo Contrast  Result Date: 08/14/2020 CLINICAL DATA:  55 year old female with witnessed seizure. Altered mental status. Fall. EXAM: CT HEAD WITHOUT CONTRAST TECHNIQUE: Contiguous axial images were obtained from the base of the skull through the vertex without intravenous contrast. COMPARISON:  Brain MRI 10/17/2010.  Head CT 10/15/2010. FINDINGS: Brain: Little cerebral volume loss since 2012. No midline shift, ventriculomegaly, mass effect, evidence of mass lesion, intracranial hemorrhage or evidence of cortically based acute infarction. Advanced bilateral cerebral white matter hypodensity in a patchy, scattered configuration is fairly symmetric. Deep gray nuclei appear relatively spared. No cortical encephalomalacia. Vascular: Mild Calcified atherosclerosis at the skull base. No suspicious intracranial vascular hyperdensity. Skull: Stable and intact. Sinuses/Orbits: Visualized paranasal sinuses and mastoids are stable and well aerated. Other: No orbit or scalp soft tissue injury identified. IMPRESSION: 1. No acute intracranial abnormality or acute traumatic injury identified. 2. Advanced cerebral white matter disease, nonspecific but most commonly due to chronic small vessel disease. Electronically Signed   By: Genevie Ann M.D.   On: 08/14/2020 08:21   DG Chest Port 1 View  Result Date: 08/14/2020 CLINICAL DATA:  Seizure, altered mental status EXAM: PORTABLE CHEST 1 VIEW COMPARISON:  02/10/2019 FINDINGS: Heart and mediastinal contours are within normal limits. No focal opacities or effusions. No acute bony abnormality. IMPRESSION: No active disease. Electronically Signed   By: Rolm Baptise M.D.   On: 08/14/2020 08:31    Procedures .Critical Care  Date/Time: 08/14/2020 12:34  PM Performed by: Noemi Chapel, MD Authorized by: Noemi Chapel, MD   Critical care provider statement:    Critical care time (minutes):  35   Critical care time was exclusive of:  Separately billable procedures and treating other patients and teaching time   Critical care was necessary to treat or prevent imminent or life-threatening deterioration of the following conditions:  Cardiac failure   Critical care was time spent personally by me on the following activities:  Blood draw for specimens, development of treatment plan with patient or surrogate, discussions with consultants, evaluation of patient's response to treatment, examination of patient, obtaining history from patient or surrogate, ordering and performing treatments and interventions, ordering and review of laboratory studies, ordering and review of radiographic studies, pulse oximetry, re-evaluation of patient's condition and review of old charts   Medications Ordered in ED Medications  empagliflozin (JARDIANCE) tablet 10 mg (has no administration in time range)  metFORMIN (GLUCOPHAGE-XR) 24 hr tablet 1,000 mg (has no administration in time range)  irbesartan (AVAPRO) tablet 150 mg (150 mg Oral Given 08/14/20 1016)  hydrochlorothiazide (HYDRODIURIL) tablet 25 mg (25 mg Oral Given 08/14/20 1016)  levothyroxine (SYNTHROID) tablet 200 mcg (has no administration in time range)  aspirin tablet 325 mg (325 mg Oral Given 08/14/20 1132)  ondansetron (ZOFRAN) injection 4 mg (4 mg Intravenous Given 08/14/20 0838)  LORazepam (ATIVAN) injection 1 mg (1 mg Intravenous Given 08/14/20 1151)    ED Course  I have reviewed the triage vital signs and the nursing notes.  Pertinent labs & imaging results that were available during my care of the  patient were reviewed by me and considered in my medical decision making (see chart for details).    MDM Rules/Calculators/A&P                          At this time the patient is definitely confused, she  has tachycardia, she is not hypotensive in fact she is hypertensive.  Will obtain labs chest x-ray and head CT, this patient does appear to be critically ill, awaiting family to assist with history.  I have viewed the patient's labs, she is hyperglycemic at 359 but no other significant metabolic abnormalities.  Her troponin level rose from 42-82 suggesting that there is some type of underlying cardiac ischemia or stress.  CBC is normal, TSH was significantly and severely elevated at 37.5 suggestive that the patient is not compliant with her levothyroxine.  Alcohol undetectable, magnesium low, urinalysis with glucosuria but no ketones and a drug screen was negative.  Chest x-ray was unremarkable, CT scan of the head did not show any acute findings just "advance cerebral white matter disease".  Neurology is on board evaluating the patient because of the seizures, there has been no recurrent seizures.  They asked for an EEG and an MRI which have been ordered.  Because of the rise in the troponin from 42-82 I consulted with hospitalist will admit.  Final Clinical Impression(s) / ED Diagnoses Final diagnoses:  Seizure ([place])  Elevated troponin  Hypertension, unspecified type  Mania ([place])     Noemi Chapel, MD 08/14/20 1235

## 2020-08-14 NOTE — Progress Notes (Signed)
EEG complete - results pending 

## 2020-08-15 ENCOUNTER — Inpatient Hospital Stay (HOSPITAL_COMMUNITY): Payer: BC Managed Care – PPO

## 2020-08-15 DIAGNOSIS — R569 Unspecified convulsions: Secondary | ICD-10-CM | POA: Diagnosis not present

## 2020-08-15 DIAGNOSIS — Z9114 Patient's other noncompliance with medication regimen: Secondary | ICD-10-CM | POA: Diagnosis not present

## 2020-08-15 DIAGNOSIS — R778 Other specified abnormalities of plasma proteins: Secondary | ICD-10-CM

## 2020-08-15 DIAGNOSIS — I700 Atherosclerosis of aorta: Secondary | ICD-10-CM | POA: Diagnosis not present

## 2020-08-15 DIAGNOSIS — I10 Essential (primary) hypertension: Secondary | ICD-10-CM | POA: Diagnosis not present

## 2020-08-15 LAB — ECHOCARDIOGRAM COMPLETE
Area-P 1/2: 2.6 cm2
Height: 63 in
P 1/2 time: 795 msec
S' Lateral: 1.7 cm
Weight: 3439.18 oz

## 2020-08-15 LAB — URINE CULTURE

## 2020-08-15 LAB — BRAIN NATRIURETIC PEPTIDE: B Natriuretic Peptide: 265 pg/mL — ABNORMAL HIGH (ref 0.0–100.0)

## 2020-08-15 LAB — GLUCOSE, CAPILLARY
Glucose-Capillary: 221 mg/dL — ABNORMAL HIGH (ref 70–99)
Glucose-Capillary: 142 mg/dL — ABNORMAL HIGH (ref 70–99)

## 2020-08-15 NOTE — Progress Notes (Addendum)
Progress Note  Patient Name: [name] Date of Encounter: 08/15/2020  [facility] Cardiologist: New (Dr. Flora Lipps)  Subjective   No acute overnight events. Patient is feeling well this morning. She feels steadier on her feet. No recurrent seizure like activity or syncope. No chest pain. She has chronic shortness of breath from her COPD but this is stable. She is very eager to go home.  Inpatient Medications    Scheduled Meds:  aspirin  325 mg Oral Daily   buPROPion  300 mg Oral Daily   busPIRone  7.5 mg Oral BID   empagliflozin  10 mg Oral Daily   enoxaparin (LOVENOX) injection  40 mg Subcutaneous Q24H   fenofibrate  160 mg Oral Daily   fluticasone furoate-vilanterol  1 puff Inhalation Daily   hydrochlorothiazide  25 mg Oral Daily   insulin aspart  0-15 Units Subcutaneous TID WC   irbesartan  150 mg Oral Daily   levothyroxine  100 mcg Oral Q0600   metFORMIN  1,000 mg Oral Q breakfast   rosuvastatin  40 mg Oral QPM   Continuous Infusions:  PRN Meds: acetaminophen, albuterol, aspirin-acetaminophen-caffeine, labetalol, LORazepam, meclizine, naproxen, ondansetron **OR** ondansetron (ZOFRAN) IV   Vital Signs    Vitals:   08/14/20 2036 08/15/20 0502 08/15/20 0628 08/15/20 0857  BP: (!) 139/93 (!) 146/87  (!) 151/110  Pulse: 84 85  82  Resp: 18 16  18   Temp: 98.6 F (37 C) 97.8 F (36.6 C)  98.5 F (36.9 C)  TempSrc: Oral Oral  Oral  SpO2: 93% (!) 84% 93% 93%  Weight: 97.5 kg     Height:        Intake/Output Summary (Last 24 hours) at 08/15/2020 1029 Last data filed at 08/15/2020 0854 Gross per 24 hour  Intake 780 ml  Output 0 ml  Net 780 ml   Last 3 Weights 08/14/2020 08/14/2020 05/02/2020  Weight (lbs) 214 lb 15.2 oz 213 lb 6.5 oz 210 lb  Weight (kg) 97.5 kg 96.8 kg 95.255 kg      Telemetry   Normal sinus rhythm with rates in the 80s. - Personally Reviewed  ECG    No new ECG tracing today. - Personally Reviewed  Physical Exam   GEN: No acute  distress.   Neck: No JVD. Cardiac: RRR. No murmurs, rubs, or gallops.  Respiratory: Clear to auscultation bilaterally. No significant wheezes, rhonchi, or rales. GI: Soft, non-distended, and non-tender. MS: Trace lower extremity edema bilaterally. No deformity. Skin: Warm and dry. Neuro:  No focal deficits. Psych: Normal affect. Responds appropriately.  Labs    High Sensitivity Troponin:   Recent Labs  Lab 08/14/20 0802 08/14/20 0950 08/14/20 1348 08/14/20 1613  TROPONINIHS 42* 82* 167* 204*      Chemistry Recent Labs  Lab 08/14/20 0802  NA 134*  K 4.6  CL 101  CO2 22  GLUCOSE 359*  BUN 12  CREATININE 0.92  CALCIUM 8.7*  PROT 6.4*  ALBUMIN 3.4*  AST 53*  ALT 36  ALKPHOS 79  BILITOT 1.3*  GFRNONAA >60  ANIONGAP 11     Hematology Recent Labs  Lab 08/14/20 0802  WBC 9.6  RBC 4.72  HGB 14.7  HCT 44.1  MCV 93.4  MCH 31.1  MCHC 33.3  RDW 13.8  PLT 253    BNP Recent Labs  Lab 08/15/20 0316  BNP 265.0*     DDimer No results for input(s): DDIMER in the last 168 hours.   Radiology  CT Head Wo Contrast  Result Date: 08/14/2020 CLINICAL DATA:  55 year old female with witnessed seizure. Altered mental status. Fall. EXAM: CT HEAD WITHOUT CONTRAST TECHNIQUE: Contiguous axial images were obtained from the base of the skull through the vertex without intravenous contrast. COMPARISON:  Brain MRI 10/17/2010.  Head CT 10/15/2010. FINDINGS: Brain: Little cerebral volume loss since 2012. No midline shift, ventriculomegaly, mass effect, evidence of mass lesion, intracranial hemorrhage or evidence of cortically based acute infarction. Advanced bilateral cerebral white matter hypodensity in a patchy, scattered configuration is fairly symmetric. Deep gray nuclei appear relatively spared. No cortical encephalomalacia. Vascular: Mild Calcified atherosclerosis at the skull base. No suspicious intracranial vascular hyperdensity. Skull: Stable and intact. Sinuses/Orbits:  Visualized paranasal sinuses and mastoids are stable and well aerated. Other: No orbit or scalp soft tissue injury identified. IMPRESSION: 1. No acute intracranial abnormality or acute traumatic injury identified. 2. Advanced cerebral white matter disease, nonspecific but most commonly due to chronic small vessel disease. Electronically Signed   By: Odessa Fleming M.D.   On: 08/14/2020 08:21   CT Angio Chest Pulmonary Embolism (PE) W or WO Contrast  Result Date: 08/14/2020 CLINICAL DATA:  55 year old female with concern for pulmonary embolism. EXAM: CT ANGIOGRAPHY CHEST WITH CONTRAST TECHNIQUE: Multidetector CT imaging of the chest was performed using the standard protocol during bolus administration of intravenous contrast. Multiplanar CT image reconstructions and MIPs were obtained to evaluate the vascular anatomy. CONTRAST:  OMNIPAQUE IOHEXOL 350 MG/ML SOLN COMPARISON:  Chest CT dated 10/15/2010 and radiograph dated 08/14/2020. FINDINGS: Cardiovascular: There is no cardiomegaly or pericardial effusion. There is coronary vascular calcification. There is mild atherosclerotic calcification of the thoracic aorta. No aneurysmal dilatation. The origins of the great vessels of the aortic arch appear patent as visualized. No CT evidence of pulmonary embolism. Mediastinum/Nodes: No hilar or mediastinal adenopathy. The esophagus and the thyroid gland are grossly unremarkable. No mediastinal fluid collection. Lungs/Pleura: Background of emphysema. There are bibasilar linear atelectasis/scarring. No focal consolidation, pleural effusion, or pneumothorax. The central airways are patent. Upper Abdomen: Slight irregularity of the liver contour concerning for early changes of cirrhosis. Clinical correlation is recommended. Cholecystectomy. Musculoskeletal: Degenerative changes of the spine. No acute osseous pathology. Review of the MIP images confirms the above findings. IMPRESSION: No acute intrathoracic pathology. No CT  evidence of pulmonary embolism. Electronically Signed   By: Elgie Collard M.D.   On: 08/14/2020 22:51   MR Brain W and Wo Contrast  Result Date: 08/14/2020 CLINICAL DATA:  Witnessed seizure.  Altered mental status. EXAM: MRI HEAD WITHOUT AND WITH CONTRAST TECHNIQUE: Multiplanar, multiecho pulse sequences of the brain and surrounding structures were obtained without and with intravenous contrast. CONTRAST:  73mL GADAVIST GADOBUTROL 1 MMOL/ML IV SOLN COMPARISON:  Head CT same day.  MRI 10/17/2010. FINDINGS: Brain: Diffusion imaging does not show any acute or subacute infarction. No focal abnormality affects the brainstem. Few old small vessel insults within the cerebellum. Cerebral hemispheres show abnormal T2 and FLAIR signal throughout the white matter, progressive since 20/12, most consistent with chronic small vessel disease. Demyelinating disease not excluded. No mass, hemorrhage, hydrocephalus or extra-axial collection. After contrast administration, no abnormal enhancement occurs. Vascular: Major vessels at the base of the brain show flow. Skull and upper cervical spine: Negative Sinuses/Orbits: Clear/normal Other: None IMPRESSION: No acute finding. Extensive chronic small-vessel ischemic changes throughout the brain, progressive since 2012. In this patient with a history of diabetes, hypertension and hyperlipidemia, small-vessel disease is the likely cause. Demyelinating disease not excluded  on the basis of imaging, but felt less likely given the above. Electronically Signed   By: Paulina Fusi M.D.   On: 08/14/2020 13:06   DG Chest Port 1 View  Result Date: 08/15/2020 CLINICAL DATA:  Congestive heart failure EXAM: PORTABLE CHEST 1 VIEW COMPARISON:  08/14/2020 FINDINGS: Heart size upper limits of normal. Mediastinal shadows are normal. Pulmonary venous hypertension without interstitial or alveolar edema. No visible effusion. IMPRESSION: Pulmonary venous hypertension, similar to yesterday's exam. No  edema. Electronically Signed   By: Paulina Fusi M.D.   On: 08/15/2020 07:35   DG Chest Port 1 View  Result Date: 08/14/2020 CLINICAL DATA:  Seizure, altered mental status EXAM: PORTABLE CHEST 1 VIEW COMPARISON:  02/10/2019 FINDINGS: Heart and mediastinal contours are within normal limits. No focal opacities or effusions. No acute bony abnormality. IMPRESSION: No active disease. Electronically Signed   By: Charlett Nose M.D.   On: 08/14/2020 08:31   EEG adult  Result Date: 08/14/2020 Charlsie Quest, MD     08/14/2020  6:21 PM Patient Name: LINSI HUMANN MRN: [id number] Epilepsy Attending: Charlsie Quest Referring Physician/Provider: Dr Eber Hong Date: 08/14/2020 Duration: 24.26 mins Patient history: 55yo M with new onset seizure. EEG to evaluate for seizure. Level of alertness: Awake, asleep AEDs during EEG study: None Technical aspects: This EEG study was done with scalp electrodes positioned according to the 10-20 International system of electrode placement. Electrical activity was acquired at a sampling rate of 500Hz  and reviewed with a high frequency filter of 70Hz  and a low frequency filter of 1Hz . EEG data were recorded continuously and digitally stored. Description: The posterior dominant rhythm consists of 8-9 Hz activity of moderate voltage (25-35 uV) seen predominantly in posterior head regions, symmetric and reactive to eye opening and eye closing. Sleep was characterized by vertex waves, sleep spindles (12 to 14 Hz), maximal frontocentral region. Hyperventilation and photic stimulation were not performed.   IMPRESSION: This study is within normal limits. No seizures or epileptiform discharges were seen throughout the recording.    Cardiac Studies   Echo pending.  Patient Profile     56 y.o. female with a history of hypertension, hyperlipidemia, diabetes, chronic microvascular white matter changes on head CT, COPD with tobacco abuse, hypothyroidism,  anxiety/depression, memory impairment, and medication non-compliance who was admitted on 08/14/2020 after a seizure like episode. Cardiology consulted for elevated troponin and syncope.  Assessment & Plan    Syncope vs Possible Seizure - Patient presented with seizure like activity. Convulsive episode witnessed by patient.  - EKG showed no acute ischemic.  - Chest CTA negative for PE. - Echo pending. - No arrhythmias noted on telemetry to explain event. - Head CT and brain MRI unremarkable. - EEG normal but sounds like seizure. Neurology consulted. Per their note, could be generalized tonic-clonic seizure vs post syncopal convulsions vs functional.  - Suspect patient will need 30 day event monitor at discharge.  Elevated Troponin  Coronary Calcifications.  - EKG showed sinus tachycardia with RBBB but no acute ischemic changes.  - High-sensitivity troponin elevated at 42 >> 82 >> 167 >> 204.  - Echo pending. - Patient denies any chest pain. - Suspect troponin elevation is due to demand ischemia from hypertension and possible seizure. However, coronary calcifications were noted on CTA. Patient also multiple CV risk factors including HTN, DM, recent tobacco use (quit 1 week ago), and family history. May need ischemic evaluation pending Echo results. Patient is very eager  to go home and would prefer this be done as outpatient if needed.  - Continue Aspirin. Currently on 325mg  daily. Think this can be decreased to 81mg  daily.  - Continue high-intensity statin.  Dyspnea - Patient has chronic dyspnea with exertion due to COPD. She also describes orthopnea. She snore and sleep study has been recommended in the past but she has not had this done yet. - BNP mildly elevated at 265. - Chest x-ray showed pulmonary venous hypertension but no edema.  - Echo pending.  - She appears euvolemic on exam.  - Continue HCTZ as below. - I think dyspnea is multifactorial due to COPD with 40 year smoking history  and BMI of 38. BNP mildly elevated but she does not look significantly volume overloaded on exam. Will wait for Echo results. Discussed importance of weighing daily. Also agree with need for sleep study to rule out sleep apnea.  Hypertension - BP as high as the 170s/120s on arrival. Patient had not been taking her home antihypertensives.  - Home Valsartan-HCTZ 160mg -25mg  daily restarted (but placed on Irbesartan here as we do not have Valsartan on formulary). - BP mildly elevated but improved this morning but just received medications. If BP remains elevated, can increase ARB.  Hyperlipidemia - Continue Crestor 40mg  daily.  Otherwise, per primary team: - Diabetes - Hypothyroidism: TSH 37.58 (patient had not been taking Synthroid) - COPD - Anxiety/depression - Memory impairment  For questions or updates, please contact [facility] Please consult [email or web address] for contact info under        Signed, Corrin ParkerCallie E Goodrich, PA-C  08/15/2020, 10:29 AM    Personally seen and examined. Agree with APP above with the following comments: Briefly 55 yo F with syncopal event in the setting of increasing her wellbutrin for smoking cessation (HTN, HLD, Former Tobacco, [place]) Patient notes that she has had no further events Exam notable for no heart murmurs; slight resting tremor otherwise WNL Labs notable for minimal increase in novel troponins consistent with demand ischemia Personally reviewed relevant tests; Echo completed without WMA, without depressed EF, or without significant AS. Would recommend - continuing antihypertensive regiment and crestor; has luminal irregularities in her coronaries;  this is less likely a coronary event; but we will work to aggressively control her cholesterol - discussed pros-cons of heart monitoring; will defer presently - as outpatient; will discussed repeat stress test (normal Myoview in 2012).  [facility] will sign off: Medications as above No new tests ordered We  will work to arrange f/u [employer]([address] due to closeness to home)  Riley LamMahesh Briscoe Daniello, MD Cardiologist [facility]  [facility]  [address] [place], [place] [address] [phone]  12:54 PM

## 2020-08-15 NOTE — Plan of Care (Signed)
  Problem: Clinical Measurements: Goal: Ability to maintain clinical measurements within normal limits will improve Outcome: Completed/Met Goal: Will remain free from infection Outcome: Completed/Met Goal: Diagnostic test results will improve Outcome: Completed/Met Goal: Respiratory complications will improve Outcome: Completed/Met

## 2020-08-15 NOTE — Discharge Summary (Signed)
Physician Discharge Summary  [name] DVV:[id number] DOB: [birth year] DOA: 08/14/2020  PCP: Medicine, [place] date: 08/14/2020 Discharge date: 08/15/2020  Admitted From: Home Disposition: Home  Recommendations for Outpatient Follow-up:  Follow up with PCP in the next few days as possible Please obtain BMP/CBC in one week Please follow up with your previous neurology team at [facility] in 1 to 2 weeks as discussed  Home Health: None Equipment/Devices: None  Discharge Condition: Stable CODE STATUS: Full Diet recommendation: As tolerated  Brief/Interim Summary: [name] is a 55 y.o. female with medical history significant of HTN, HLD, IIDM, migraines, anxiety/depression, hypothyroid, morbid obesity, presented with new onset of seizure. Woke up this morning with double vision, soon she had an witnessed seizure tonic-clonic, no tongue biting, palpation had urinary incontinence.  The patient woke up confused. Double vision gone and no weakness or numbness of the limbs. Since this year, patient has had several episodes of double visions usually last 10 to 15 minutes, and her last episode was about 2 months ago.  Since April, she has been following with neurologist at [facility], MRI demonstrated white matter chronic microvascular ischemia versus demyelinating process.  Lumbar puncture also performed as outpatient, protein and IgG and oligoclonal bands within normal limits. Patient admitted that she has not been compliant with her medications, and she stopped taking Divan about one month ago and she has been taking other of her medications "here and there" including Synthroid.  Patient admitted as ago with a constellation of symptoms and complaints but most notably an episode of syncope versus seizure given mental status changes and noted tonic-clonic motion by family while patient was unconscious along with loss of bladder.  While patient had what appeared to be  tonic-clonic episode with postictal confusion and questionably Todd's paralysis there was inconsistently episodes of weakness, what appears to be a panic attack, as well as mental status changes and slurred speech prior to patient's tonic-clonic episode.  Patient was evaluated by neurology, we do appreciate their insight recommendations and expertise.  Given patient's age no seizure history but atypical MRI in the outpatient setting with previous neurology team she was evaluated, EEG thankfully was noted to be negative for any seizure-like activity.  We had a lengthy discussion today at bedside with patient and daughter about recent Wellbutrin medication that had been prescribed in the setting of smoking cessation which with further discussion with pharmacy does lower patient's threshold, she may be more sensitive to Wellbutrin than typical patient which may have caused a provoked seizure but again per discussion with neurology and given her constellation of findings this is not a definitive diagnosis.  We did discuss at length the need to limit if not completely discontinue driving in the interim until such time she can be evaluated by her own neurology team that she follows with for her previous abnormal findings.  Incidentally patient had minimally elevated troponin, cardiology was consulted and we appreciate their insight and recommendations, at this time patient has denied any cardiac symptomatology since admission as such we will discharge home with close outpatient follow-up with PCP to review results of echocardiogram and other testing which at this time do not appear to warrant any further inpatient work-up.  Discharge Diagnoses:  Active Problems:   Hypertension   AMS (altered mental status)   Witnessed seizure-like activity (HCC)   Medication nonadherence due to psychosocial problem    Discharge Instructions  Discharge Instructions     Call MD  for:  difficulty breathing, headache or visual  disturbances   Complete by: As directed    Call MD for:  persistant dizziness or light-headedness   Complete by: As directed    Diet - low sodium heart healthy   Complete by: As directed    Discharge instructions   Complete by: As directed    Follow-up with PCP for further discussion about Wellbutrin titration and discontinuation.  Follow thyroid panel as well, may need to increase thyroid medication per our labs but would request to confirm with PCP. Follow-up with neurology as scheduled for further evaluation of episode.   Increase activity slowly   Complete by: As directed       Allergies as of 08/15/2020       Reactions   Compazine  [prochlorperazine] Anaphylaxis   Thiethylperazine Anaphylaxis   Vancomycin Rash, Itching   Patient developed localized itching, erythema, swelling.  Onset within 2 min of starting vanc IV.  Appears to be a true vanc allergy and not red-mans syndrome. Patient developed localized itching, erythema, swelling.  Onset within 2 min of starting vanc IV.  Appears to be a true vanc allergy and not red-mans syndrome.   Compazine    Lasix [furosemide]    BREAKS OUT IN HIVES   Latex    Migraine        Medication List     TAKE these medications    acetaminophen 500 MG tablet Commonly known as: TYLENOL Take 1,000 mg by mouth every 6 (six) hours as needed for headache or mild pain.   albuterol 108 (90 Base) MCG/ACT inhaler Commonly known as: VENTOLIN HFA Inhale 2 puffs into the lungs every 4 (four) hours as needed for wheezing or shortness of breath.   BC HEADACHE PO Take 1 packet by mouth daily as needed (headache/pain).   blood glucose meter kit and supplies Dispense based on patient and insurance preference. Use up to four times daily as directed. (FOR ICD-10 E10.9, E11.9).   buPROPion 300 MG 24 hr tablet Commonly known as: WELLBUTRIN XL Take 300 mg by mouth daily.   busPIRone 7.5 MG tablet Commonly known as: BUSPAR Take 1 tablet (7.5 mg  total) by mouth 2 (two) times daily.   fenofibrate 145 MG tablet Commonly known as: Tricor Take 1 tablet (145 mg total) by mouth daily.   Jardiance 10 MG Tabs tablet Generic drug: empagliflozin Take 10 mg by mouth daily.   levothyroxine 200 MCG tablet Commonly known as: SYNTHROID Take 1 tablet (200 mcg total) by mouth daily.   meclizine 25 MG tablet Commonly known as: ANTIVERT Take 1 tablet (25 mg total) by mouth 3 (three) times daily as needed for dizziness.   metFORMIN 500 MG 24 hr tablet Commonly known as: GLUCOPHAGE-XR Take 1,000 mg by mouth in the morning and at bedtime.   naproxen sodium 220 MG tablet Commonly known as: ALEVE Take 440 mg by mouth 2 (two) times daily as needed (pain).   OneTouch Ultra test strip Generic drug: glucose blood USE UP TO 4 TIMES A DAY AS DIRECTED   rosuvastatin 40 MG tablet Commonly known as: CRESTOR Take 1 tablet (40 mg total) by mouth daily. What changed: when to take this   Symbicort 160-4.5 MCG/ACT inhaler Generic drug: budesonide-formoterol Inhale 2 puffs into the lungs 2 (two) times daily.   valsartan-hydrochlorothiazide 160-25 MG tablet Commonly known as: DIOVAN-HCT Take 1 tablet by mouth daily. **PATIENT NEEDS APT FOR FURTHER REFILLS** What changed: additional instructions  Follow-up Information     Werner Lean, MD Follow up.   Specialty: Cardiology Why: Hospital follow-up with Cardiology scheduled for 08/28/2020 at 8:20am. Please arrive 15 minutes early for check-in. If this date/time does not work for you, please call our office to reschedule. Contact information: [address] [place] [address] [phone]                Allergies  Allergen Reactions   Compazine  [Prochlorperazine] Anaphylaxis   Thiethylperazine Anaphylaxis   Vancomycin Rash and Itching    Patient developed localized itching, erythema, swelling.  Onset within 2 min of starting vanc IV.  Appears to be a  true vanc allergy and not red-mans syndrome. Patient developed localized itching, erythema, swelling.  Onset within 2 min of starting vanc IV.  Appears to be a true vanc allergy and not red-mans syndrome.    Compazine    Lasix [Furosemide]     BREAKS OUT IN HIVES   Latex     Migraine    Consultations: Cardiology, neurology   Procedures/Studies: CT Head Wo Contrast  Result Date: 08/14/2020 CLINICAL DATA:  55 year old female with witnessed seizure. Altered mental status. Fall. EXAM: CT HEAD WITHOUT CONTRAST TECHNIQUE: Contiguous axial images were obtained from the base of the skull through the vertex without intravenous contrast. COMPARISON:  Brain MRI 10/17/2010.  Head CT 10/15/2010. FINDINGS: Brain: Little cerebral volume loss since 2012. No midline shift, ventriculomegaly, mass effect, evidence of mass lesion, intracranial hemorrhage or evidence of cortically based acute infarction. Advanced bilateral cerebral white matter hypodensity in a patchy, scattered configuration is fairly symmetric. Deep gray nuclei appear relatively spared. No cortical encephalomalacia. Vascular: Mild Calcified atherosclerosis at the skull base. No suspicious intracranial vascular hyperdensity. Skull: Stable and intact. Sinuses/Orbits: Visualized paranasal sinuses and mastoids are stable and well aerated. Other: No orbit or scalp soft tissue injury identified. IMPRESSION: 1. No acute intracranial abnormality or acute traumatic injury identified. 2. Advanced cerebral white matter disease, nonspecific but most commonly due to chronic small vessel disease. Electronically Signed   By: Genevie Ann M.D.   On: 08/14/2020 08:21   CT Angio Chest Pulmonary Embolism (PE) W or WO Contrast  Result Date: 08/14/2020 CLINICAL DATA:  55 year old female with concern for pulmonary embolism. EXAM: CT ANGIOGRAPHY CHEST WITH CONTRAST TECHNIQUE: Multidetector CT imaging of the chest was performed using the standard protocol during bolus  administration of intravenous contrast. Multiplanar CT image reconstructions and MIPs were obtained to evaluate the vascular anatomy. CONTRAST:  180m OMNIPAQUE IOHEXOL 350 MG/ML SOLN COMPARISON:  Chest CT dated 10/15/2010 and radiograph dated 08/14/2020. FINDINGS: Cardiovascular: There is no cardiomegaly or pericardial effusion. There is coronary vascular calcification. There is mild atherosclerotic calcification of the thoracic aorta. No aneurysmal dilatation. The origins of the great vessels of the aortic arch appear patent as visualized. No CT evidence of pulmonary embolism. Mediastinum/Nodes: No hilar or mediastinal adenopathy. The esophagus and the thyroid gland are grossly unremarkable. No mediastinal fluid collection. Lungs/Pleura: Background of emphysema. There are bibasilar linear atelectasis/scarring. No focal consolidation, pleural effusion, or pneumothorax. The central airways are patent. Upper Abdomen: Slight irregularity of the liver contour concerning for early changes of cirrhosis. Clinical correlation is recommended. Cholecystectomy. Musculoskeletal: Degenerative changes of the spine. No acute osseous pathology. Review of the MIP images confirms the above findings. IMPRESSION: No acute intrathoracic pathology. No CT evidence of pulmonary embolism. Electronically Signed   By: AAnner CreteM.D.   On: 08/14/2020 22:51  MR Brain W and Wo Contrast  Result Date: 08/14/2020 CLINICAL DATA:  Witnessed seizure.  Altered mental status. EXAM: MRI HEAD WITHOUT AND WITH CONTRAST TECHNIQUE: Multiplanar, multiecho pulse sequences of the brain and surrounding structures were obtained without and with intravenous contrast. CONTRAST:  55m GADAVIST GADOBUTROL 1 MMOL/ML IV SOLN COMPARISON:  Head CT same day.  MRI 10/17/2010. FINDINGS: Brain: Diffusion imaging does not show any acute or subacute infarction. No focal abnormality affects the brainstem. Few old small vessel insults within the cerebellum. Cerebral  hemispheres show abnormal T2 and FLAIR signal throughout the white matter, progressive since 20/12, most consistent with chronic small vessel disease. Demyelinating disease not excluded. No mass, hemorrhage, hydrocephalus or extra-axial collection. After contrast administration, no abnormal enhancement occurs. Vascular: Major vessels at the base of the brain show flow. Skull and upper cervical spine: Negative Sinuses/Orbits: Clear/normal Other: None IMPRESSION: No acute finding. Extensive chronic small-vessel ischemic changes throughout the brain, progressive since 2012. In this patient with a history of diabetes, hypertension and hyperlipidemia, small-vessel disease is the likely cause. Demyelinating disease not excluded on the basis of imaging, but felt less likely given the above. Electronically Signed   By: MNelson ChimesM.D.   On: 08/14/2020 13:06   DG Chest Port 1 View  Result Date: 08/15/2020 CLINICAL DATA:  Congestive heart failure EXAM: PORTABLE CHEST 1 VIEW COMPARISON:  08/14/2020 FINDINGS: Heart size upper limits of normal. Mediastinal shadows are normal. Pulmonary venous hypertension without interstitial or alveolar edema. No visible effusion. IMPRESSION: Pulmonary venous hypertension, similar to yesterday's exam. No edema. Electronically Signed   By: MNelson ChimesM.D.   On: 08/15/2020 07:35   DG Chest Port 1 View  Result Date: 08/14/2020 CLINICAL DATA:  Seizure, altered mental status EXAM: PORTABLE CHEST 1 VIEW COMPARISON:  02/10/2019 FINDINGS: Heart and mediastinal contours are within normal limits. No focal opacities or effusions. No acute bony abnormality. IMPRESSION: No active disease. Electronically Signed   By: KRolm BaptiseM.D.   On: 08/14/2020 08:31   EEG adult  Result Date: 08/14/2020 YLora Havens MD     08/14/2020  6:21 PM Patient Name: Michelle FAVAROMRN: [id number] Attending: PLora HavensReferring Physician/Provider: Dr BNoemi ChapelDate: 08/14/2020 Duration:  24.26 mins Patient history: 89yoM with new onset seizure. EEG to evaluate for seizure. Level of alertness: Awake, asleep AEDs during EEG study: None Technical aspects: This EEG study was done with scalp electrodes positioned according to the 10-20 International system of electrode placement. Electrical activity was acquired at a sampling rate of '500Hz'  and reviewed with a high frequency filter of '70Hz'  and a low frequency filter of '1Hz' . EEG data were recorded continuously and digitally stored. Description: The posterior dominant rhythm consists of 8-9 Hz activity of moderate voltage (25-35 uV) seen predominantly in posterior head regions, symmetric and reactive to eye opening and eye closing. Sleep was characterized by vertex waves, sleep spindles (12 to 14 Hz), maximal frontocentral region. Hyperventilation and photic stimulation were not performed.   IMPRESSION: This study is within normal limits. No seizures or epileptiform discharges were seen throughout the recording. PLora Havens  ECHOCARDIOGRAM COMPLETE  Result Date: 08/15/2020    ECHOCARDIOGRAM REPORT   Patient Name:   Michelle FRASCODate of Exam: 08/15/2020 Medical Rec #:  [id number]        Height:       63.0 in Accession #:    [id number]  Weight:       214.9 lb Date of Birth:  Mar 11, 1965          BSA:          1.994 m Patient Age:    54 years          BP:           146/87 mmHg Patient Gender: F                 HR:           82 bpm. Exam Location:  Inpatient Procedure: 2D Echo, Cardiac Doppler and Color Doppler Indications:    Elevated Troponin  History:        Patient has prior history of Echocardiogram examinations, most                 recent 08/12/2010. TIA; Risk Factors:Diabetes, Hypertension and                 Dyslipidemia.  Sonographer:    Bernadene Person RDCS Referring Phys: [id number] [place]  1. Left ventricular ejection fraction, by estimation, is 55 to 60%. The left ventricle has normal function. The left ventricle  has no regional wall motion abnormalities. There is moderate left ventricular hypertrophy. Left ventricular diastolic parameters are consistent with Grade I diastolic dysfunction (impaired relaxation).  2. Right ventricular systolic function is normal. The right ventricular size is normal. There is normal pulmonary artery systolic pressure. The estimated right ventricular systolic pressure is 29.1 mmHg.  3. The mitral valve is normal in structure. Trivial mitral valve regurgitation. No evidence of mitral stenosis.  4. The aortic valve is tricuspid. Aortic valve regurgitation is trivial. Mild aortic valve sclerosis is present, with no evidence of aortic valve stenosis.  5. The inferior vena cava is normal in size with greater than 50% respiratory variability, suggesting right atrial pressure of 3 mmHg. FINDINGS  Left Ventricle: Left ventricular ejection fraction, by estimation, is 55 to 60%. The left ventricle has normal function. The left ventricle has no regional wall motion abnormalities. The left ventricular internal cavity size was normal in size. There is  moderate left ventricular hypertrophy. Left ventricular diastolic parameters are consistent with Grade I diastolic dysfunction (impaired relaxation). Right Ventricle: The right ventricular size is normal. No increase in right ventricular wall thickness. Right ventricular systolic function is normal. There is normal pulmonary artery systolic pressure. The tricuspid regurgitant velocity is 2.49 m/s, and  with an assumed right atrial pressure of 3 mmHg, the estimated right ventricular systolic pressure is 91.6 mmHg. Left Atrium: Left atrial size was normal in size. Right Atrium: Right atrial size was normal in size. Pericardium: There is no evidence of pericardial effusion. Mitral Valve: The mitral valve is normal in structure. There is mild calcification of the mitral valve leaflet(s). Mild mitral annular calcification. Trivial mitral valve regurgitation. No  evidence of mitral valve stenosis. Tricuspid Valve: The tricuspid valve is normal in structure. Tricuspid valve regurgitation is trivial. Aortic Valve: The aortic valve is tricuspid. Aortic valve regurgitation is trivial. Aortic regurgitation PHT measures 795 msec. Mild aortic valve sclerosis is present, with no evidence of aortic valve stenosis. Pulmonic Valve: The pulmonic valve was normal in structure. Pulmonic valve regurgitation is not visualized. Aorta: The aortic root is normal in size and structure. Venous: The inferior vena cava is normal in size with greater than 50% respiratory variability, suggesting right atrial pressure of 3 mmHg. IAS/Shunts: No atrial level shunt detected  by color flow Doppler.  LEFT VENTRICLE PLAX 2D LVIDd:         3.20 cm  Diastology LVIDs:         1.70 cm  LV e' medial:    4.06 cm/s LV PW:         1.20 cm  LV E/e' medial:  23.6 LV IVS:        1.30 cm  LV e' lateral:   5.50 cm/s LVOT diam:     2.10 cm  LV E/e' lateral: 17.5 LV SV:         66 LV SV Index:   33 LVOT Area:     3.46 cm  RIGHT VENTRICLE RV S prime:     11.70 cm/s TAPSE (M-mode): 1.7 cm LEFT ATRIUM             Index       RIGHT ATRIUM           Index LA diam:        3.00 cm 1.50 cm/m  RA Area:     14.40 cm LA Vol (A2C):   37.6 ml 18.86 ml/m RA Volume:   35.60 ml  17.86 ml/m LA Vol (A4C):   34.4 ml 17.25 ml/m LA Biplane Vol: 35.8 ml 17.96 ml/m  AORTIC VALVE LVOT Vmax:   93.10 cm/s LVOT Vmean:  60.900 cm/s LVOT VTI:    0.191 m AI PHT:      795 msec  AORTA Ao Root diam: 3.20 cm Ao Asc diam:  3.30 cm MITRAL VALVE                TRICUSPID VALVE MV Area (PHT): 2.60 cm     TR Peak grad:   24.8 mmHg MV Decel Time: 292 msec     TR Vmax:        249.00 cm/s MV E velocity: 96.00 cm/s MV A velocity: 115.00 cm/s  SHUNTS MV E/A ratio:  0.83         Systemic VTI:  0.19 m                             Systemic Diam: 2.10 cm Loralie Champagne MD Electronically signed by Loralie Champagne MD Signature Date/Time: 08/15/2020/3:17:17 PM    Final       Subjective: No acute issues or events since admission, denies any further abnormal speech gait or weaknesses, denies nausea vomiting diarrhea constipation headache fevers chills chest pain shortness of breath.  Family at bedside states the patient is back to her baseline and is otherwise requesting discharge home which is certainly reasonable given her negative work-up here.   Discharge Exam: Vitals:   08/15/20 0628 08/15/20 0857  BP:  (!) 151/110  Pulse:  82  Resp:  18  Temp:  98.5 F (36.9 C)  SpO2: 93% 93%   Vitals:   08/14/20 2036 08/15/20 0502 08/15/20 0628 08/15/20 0857  BP: (!) 139/93 (!) 146/87  (!) 151/110  Pulse: 84 85  82  Resp: '18 16  18  ' Temp: 98.6 F (37 C) 97.8 F (36.6 C)  98.5 F (36.9 C)  TempSrc: Oral Oral  Oral  SpO2: 93% (!) 84% 93% 93%  Weight: 97.5 kg     Height:        General: Pt is alert, awake, not in acute distress Cardiovascular: RRR, S1/S2 +, no rubs, no gallops Respiratory: CTA bilaterally, no wheezing, no rhonchi Abdominal:  Soft, NT, ND, bowel sounds + Extremities: no edema, no cyanosis    The results of significant diagnostics from this hospitalization (including imaging, microbiology, ancillary and laboratory) are listed below for reference.     Microbiology: Recent Results (from the past 240 hour(s))  Urine Culture     Status: Abnormal   Collection Time: 08/14/20  7:19 AM   Specimen: Urine, Random  Result Value Ref Range Status   Specimen Description URINE, RANDOM  Final   Special Requests   Final    NONE Performed at [facility], [address] [place], [place] [address]    Culture MULTIPLE SPECIES PRESENT, SUGGEST RECOLLECTION (A)  Final   Report Status 08/15/2020 FINAL  Final  SARS CORONAVIRUS 2 (TAT 6-24 HRS) Nasopharyngeal Nasopharyngeal Swab     Status: None   Collection Time: 08/14/20  2:16 PM   Specimen: Nasopharyngeal Swab  Result Value Ref Range Status   SARS Coronavirus 2 NEGATIVE NEGATIVE Final     Comment: (NOTE) SARS-CoV-2 target nucleic acids are NOT DETECTED.  The SARS-CoV-2 RNA is generally detectable in upper and lower respiratory specimens during the acute phase of infection. Negative results do not preclude SARS-CoV-2 infection, do not rule out co-infections with other pathogens, and should not be used as the sole basis for treatment or other patient management decisions. Negative results must be combined with clinical observations, patient history, and epidemiological information. The expected result is Negative.  Fact Sheet for Patients: SugarRoll.be  Fact Sheet for Healthcare Providers: https://www.woods-mathews.com/  This test is not yet approved or cleared by the [place] FDA and  has been authorized for detection and/or diagnosis of SARS-CoV-2 by FDA under an Emergency Use Authorization (EUA). This EUA will remain  in effect (meaning this test can be used) for the duration of the COVID-19 declaration under Se ction 564(b)(1) of the Act, 21 U.S.C. section 360bbb-3(b)(1), unless the authorization is terminated or revoked sooner.  Performed at [facility], [place] [address], [place], [place] [address]      Labs: BNP (last 3 results) Recent Labs    08/15/20 0316  BNP 248.2*   Basic Metabolic Panel: Recent Labs  Lab 08/14/20 0802  NA 134*  K 4.6  CL 101  CO2 22  GLUCOSE 359*  BUN 12  CREATININE 0.92  CALCIUM 8.7*  MG 1.6*   Liver Function Tests: Recent Labs  Lab 08/14/20 0802  AST 53*  ALT 36  ALKPHOS 79  BILITOT 1.3*  PROT 6.4*  ALBUMIN 3.4*   No results for input(s): LIPASE, AMYLASE in the last 168 hours. No results for input(s): AMMONIA in the last 168 hours. CBC: Recent Labs  Lab 08/14/20 0802  WBC 9.6  NEUTROABS 7.4  HGB 14.7  HCT 44.1  MCV 93.4  PLT 253   Cardiac Enzymes: Recent Labs  Lab 08/14/20 1348  CKTOTAL 110   BNP: Invalid input(s): POCBNP CBG: Recent Labs   Lab 08/14/20 1812 08/14/20 2032 08/15/20 0709 08/15/20 1148  GLUCAP 196* 194* 221* 142*   D-Dimer No results for input(s): DDIMER in the last 72 hours. Hgb A1c Recent Labs    08/14/20 1348  HGBA1C 8.8*   Lipid Profile No results for input(s): CHOL, HDL, LDLCALC, TRIG, CHOLHDL, LDLDIRECT in the last 72 hours. Thyroid function studies Recent Labs    08/14/20 0802  TSH 37.580*   Anemia work up No results for input(s): VITAMINB12, FOLATE, FERRITIN, TIBC, IRON, RETICCTPCT in the last 72 hours. Urinalysis  Component Value Date/Time   COLORURINE YELLOW 08/14/2020 0719   APPEARANCEUR HAZY (A) 08/14/2020 0719   LABSPEC 1.018 08/14/2020 0719   PHURINE 5.0 08/14/2020 0719   GLUCOSEU >=500 (A) 08/14/2020 0719   HGBUR NEGATIVE 08/14/2020 0719   BILIRUBINUR NEGATIVE 08/14/2020 0719   KETONESUR NEGATIVE 08/14/2020 0719   PROTEINUR 100 (A) 08/14/2020 0719   NITRITE NEGATIVE 08/14/2020 0719   LEUKOCYTESUR NEGATIVE 08/14/2020 0719   Sepsis Labs Invalid input(s): PROCALCITONIN,  WBC,  LACTICIDVEN Microbiology Recent Results (from the past 240 hour(s))  Urine Culture     Status: Abnormal   Collection Time: 08/14/20  7:19 AM   Specimen: Urine, Random  Result Value Ref Range Status   Specimen Description URINE, RANDOM  Final   Special Requests   Final    NONE Performed at [facility], [place] [address] [place], [place] [address]    Culture MULTIPLE SPECIES PRESENT, SUGGEST RECOLLECTION (A)  Final   Report Status 08/15/2020 FINAL  Final  SARS CORONAVIRUS 2 (TAT 6-24 HRS) Nasopharyngeal Nasopharyngeal Swab     Status: None   Collection Time: 08/14/20  2:16 PM   Specimen: Nasopharyngeal Swab  Result Value Ref Range Status   SARS Coronavirus 2 NEGATIVE NEGATIVE Final    Comment: (NOTE) SARS-CoV-2 target nucleic acids are NOT DETECTED.  The SARS-CoV-2 RNA is generally detectable in upper and lower respiratory specimens during the acute phase of infection.  Negative results do not preclude SARS-CoV-2 infection, do not rule out co-infections with other pathogens, and should not be used as the sole basis for treatment or other patient management decisions. Negative results must be combined with clinical observations, patient history, and epidemiological information. The expected result is Negative.  Fact Sheet for Patients: SugarRoll.be  Fact Sheet for Healthcare Providers: https://www.woods-mathews.com/  This test is not yet approved or cleared by the [place] FDA and  has been authorized for detection and/or diagnosis of SARS-CoV-2 by FDA under an Emergency Use Authorization (EUA). This EUA will remain  in effect (meaning this test can be used) for the duration of the COVID-19 declaration under Se ction 564(b)(1) of the Act, 21 U.S.C. section 360bbb-3(b)(1), unless the authorization is terminated or revoked sooner.  Performed at [facility], [place] [address], [place], [place] [address]      Time coordinating discharge: Over 30 minutes  SIGNED:   Little Ishikawa, DO [facility] 08/15/2020, 6:48 PM Pager   If 7PM-7AM, please contact night-coverage [email or web address]

## 2020-08-15 NOTE — Progress Notes (Signed)
  Echocardiogram 2D Echocardiogram has been performed.  Augustine Radar 08/15/2020, 10:05 AM

## 2020-08-15 NOTE — Progress Notes (Signed)
DISCHARGE NOTE HOME [name] to be discharged Home per MD order. Discussed prescriptions and follow up appointments with the patient. Prescriptions given to patient; medication list explained in detail. Patient verbalized understanding.  Skin clean, dry and intact without evidence of skin break down, no evidence of skin tears noted. IV catheter discontinued intact. Site without signs and symptoms of complications. Dressing and pressure applied. Pt denies pain at the site currently. No complaints noted.  Patient free of lines, drains, and wounds.   An After Visit Summary (AVS) was printed and given to the patient. Patient escorted via wheelchair, and discharged home via private auto.  Velia Meyer, RN

## 2020-08-27 NOTE — Progress Notes (Deleted)
Cardiology Office Note:    Date:  08/27/2020   ID:  [name], DOB 07-Feb-1965, MRN [id number]  PCP:  Medicine, [facility]   [facility] Providers Cardiologist:  Christell Constant, MD { Click to update primary MD,subspecialty MD or APP then REFRESH:1}    Referring MD: [facility], [facility]*   CC:  follow up from near syncope  Needs EKG  History of Present Illness:    [name] is a 55 y.o. female with a hx of COPD; Tobacco abuse, Prior TIA, and HTN, HLD and Morbid Obesity, with near syncope follow up.  Patient notes that she is doing ***.  Since ED discharge has felt ***.    No chest pain or pressure ***.  No SOB/DOE*** and no PND/Orthopnea***.  No weight gain or leg swelling***.  No palpitations or syncope ***.  Ambulatory blood pressure ***.   Past Medical History:  Diagnosis Date   Anxiety and depression    Diabetes mellitus    GERD (gastroesophageal reflux disease)    HTN (hypertension)    Hyperlipidemia    Hypothyroidism    TIA (transient ischemic attack)     Past Surgical History:  Procedure Laterality Date   ABDOMINAL HYSTERECTOMY     Ankle surgery x 2     Exploratory Laparoscopy     PARTIAL HYSTERECTOMY      Current Medications: No outpatient medications have been marked as taking for the 08/28/20 encounter (Appointment) with Christell Constant, MD.     Allergies:   Compazine  [prochlorperazine], Thiethylperazine, Vancomycin, Compazine, Lasix [furosemide], and Latex   Social History   Socioeconomic History   Marital status: Legally Separated    Spouse name: Not on file   Number of children: 1   Years of education: Not on file   Highest education level: Not on file  Occupational History    Employer: [employer]  Tobacco Use   Smoking status: Every Day    Packs/day: 1.00    Years: 40.00    Pack years: 40.00    Types: Cigarettes   Smokeless tobacco: Never  Vaping Use   Vaping Use:  Never used  Substance and Sexual Activity   Alcohol use: Not Currently   Drug use: No   Sexual activity: Not Currently  Other Topics Concern   Not on file  Social History Narrative   Not on file   Social Determinants of Health   Financial Resource Strain: Not on file  Food Insecurity: Not on file  Transportation Needs: Not on file  Physical Activity: Not on file  Stress: Not on file  Social Connections: Not on file    Social: Daughter was at first eval (inpatient)  Family History: The patient's family history includes Diabetes in her father; Heart attack in her father, maternal grandfather, paternal grandfather, and paternal uncle; Hyperlipidemia in her father, maternal grandfather, maternal grandmother, paternal grandfather, and paternal grandmother; Stroke in her maternal grandmother and paternal grandmother.  ROS:   Please see the history of present illness.     All other systems reviewed and are negative.  EKGs/Labs/Other Studies Reviewed:    The following studies were reviewed today:  EKG:   08/28/20: ***  Cardiac Event Monitoring***: Date: Results:  Transthoracic Echocardiogram: Date: 08/15/20 Results:  1. Left ventricular ejection fraction, by estimation, is 55 to 60%. The  left ventricle has normal function. The left ventricle has no regional  wall motion abnormalities. There is moderate left ventricular  hypertrophy.  Left ventricular diastolic  parameters are consistent with Grade I diastolic dysfunction (impaired  relaxation).   2. Right ventricular systolic function is normal. The right ventricular  size is normal. There is normal pulmonary artery systolic pressure. The  estimated right ventricular systolic pressure is 27.8 mmHg.   3. The mitral valve is normal in structure. Trivial mitral valve  regurgitation. No evidence of mitral stenosis.   4. The aortic valve is tricuspid. Aortic valve regurgitation is trivial.  Mild aortic valve sclerosis is  present, with no evidence of aortic valve  stenosis.   5. The inferior vena cava is normal in size with greater than 50%  respiratory variability, suggesting right atrial pressure of 3 mmHg.   CTPE: Date: 08/14/2020 Results: LUMI no PE  NM Stress Testing : Date:08/12/2010 Results: WNL   Recent Labs: 08/14/2020: ALT 36; BUN 12; Creatinine, Ser 0.92; Hemoglobin 14.7; Magnesium 1.6; Platelets 253; Potassium 4.6; Sodium 134; TSH 37.580 08/15/2020: B Natriuretic Peptide 265.0  Recent Lipid Panel    Component Value Date/Time   CHOL 173 10/17/2010 0605   TRIG 308 (H) 10/17/2010 0605   HDL 30 (L) 10/17/2010 0605   CHOLHDL 5.8 10/17/2010 0605   VLDL 62 (H) 10/17/2010 0605   LDLCALC 81 10/17/2010 0605     Risk Assessment/Calculations:   {Does this patient have ATRIAL FIBRILLATION?:[id number]}       Physical Exam:    VS:  There were no vitals taken for this visit.    Wt Readings from Last 3 Encounters:  08/14/20 214 lb 15.2 oz (97.5 kg)  05/02/20 210 lb (95.3 kg)  04/05/19 197 lb 9.6 oz (89.6 kg)     GEN: *** Well nourished, well developed in no acute distress HEENT: Normal NECK: No JVD; No carotid bruits LYMPHATICS: No lymphadenopathy CARDIAC: ***RRR, no murmurs, rubs, gallops RESPIRATORY:  Clear to auscultation without rales, wheezing or rhonchi  ABDOMEN: Soft, non-tender, non-distended MUSCULOSKELETAL:  No edema; No deformity  SKIN: Warm and dry NEUROLOGIC:  Alert and oriented x 3 PSYCHIATRIC:  Normal affect   ASSESSMENT:    No diagnosis found. PLAN:    HTN HLD Former Tobacco Morbid Obesity - LDL goal < 70 *** stress test - *** ziopatch  {Are you ordering a CV Procedure (e.g. stress test, cath, DCCV, TEE, etc)?   Press F2        :[id number]}    Medication Adjustments/Labs and Tests Ordered: Current medicines are reviewed at length with the patient today.  Concerns regarding medicines are outlined above.  No orders of the defined types were placed in this  encounter.  No orders of the defined types were placed in this encounter.   There are no Patient Instructions on file for this visit.   Signed, Christell Constant, MD  08/27/2020 5:09 PM    [facility]

## 2020-08-28 ENCOUNTER — Ambulatory Visit: Payer: BC Managed Care – PPO | Admitting: Internal Medicine

## 2020-09-11 ENCOUNTER — Encounter: Payer: Self-pay | Admitting: *Deleted

## 2020-09-16 ENCOUNTER — Ambulatory Visit: Payer: BC Managed Care – PPO | Admitting: Diagnostic Neuroimaging

## 2020-09-16 ENCOUNTER — Other Ambulatory Visit: Payer: Self-pay

## 2020-09-16 ENCOUNTER — Encounter: Payer: Self-pay | Admitting: Diagnostic Neuroimaging

## 2020-09-16 VITALS — BP 127/79 | HR 94 | Ht 63.0 in | Wt 209.2 lb

## 2020-09-16 DIAGNOSIS — R569 Unspecified convulsions: Secondary | ICD-10-CM

## 2020-09-16 DIAGNOSIS — G4719 Other hypersomnia: Secondary | ICD-10-CM | POA: Diagnosis not present

## 2020-09-16 NOTE — Patient Instructions (Signed)
  NEW ONSET SEIZURE (08/14/20) - possibly related to increased wellbutrin dose (had increased from 300mg  twice a day to 600g twice a day on her own, 3 days before seizure) - now on wellbutrin 150mg  daily - According to [place] law, you can not drive unless you are seizure / syncope free for at least 6 months and under physician's care. Since this was likely provoked by increased wellbutrin, could shorten time frame to 3 months seizure free. - Please maintain precautions. Do not participate in activities where a loss of awareness could harm you or someone else. No swimming alone, no tub bathing, no hot tubs, no driving, no operating motorized vehicles (cars, ATVs, motocycles, etc), lawnmowers, power tools or firearms. No standing at heights, such as rooftops, ladders or stairs. Avoid hot objects such as stoves, heaters, open fires. Wear a helmet when riding a bicycle, scooter, skateboard, etc. and avoid areas of traffic. Set your water heater to 120 degrees or less.   TREMORS / MEMORY LOSS / DOUBLE VISION - could be related to thyroid dysfunction, insomnia, depression, anxiety, diabetes, HTN, chronic small vessel ischemic disease - continue medical mgmt   INSOMNIA / DAYTIME SLEEPINESS / OBESITY - check sleep study

## 2020-09-16 NOTE — Progress Notes (Signed)
[facility]  PATIENT: [name] DOB: 14-Jun-1965  REFERRING CLINICIAN: Aura Dials, PA-C HISTORY FROM: patient  REASON FOR VISIT: new consult    HISTORICAL  CHIEF COMPLAINT:  Chief Complaint  Patient presents with   Seizure-like activity    Rm 7 New Pt  "2nd opinion, believe seizure was induced by Wellbutrin dose being doubled by myself, witnessed by my sister, quit breathing-turned blue; I don't remember a lot, hospitalized overnight"    HISTORY OF PRESENT ILLNESS:   55 year old female here for evaluation of new onset seizure, tremors, vision changes, muscle cramps, memory loss.  Since early 2022 patient had onset of tremors, vision changes muscle cramps memory loss.  She had MRI of the brain in April 2022 which showed nonspecific T2 hyperintensities in the white matter.  She saw another neurologist who ordered LP and ruled out autoimmune, inflammatory, demyelinating issues.  Patient has history of diabetes and smoking.  She was taking Wellbutrin 300 mg twice a day, but apparently increased to 600 mg twice a day on her own to help quit smoking.  3 days later on August 14, 2020 patient had significant confusion and witnessed seizure event at home.  She went to the hospital for evaluation.  MRI and EEG were unremarkable.  Her TSH was also found to be significantly elevated.  Question of medication compliance was raised.  Since that time patient has been taking her medication more regularly and TSH has improved.  Her Wellbutrin has been reduced to 150 mg/day.  Patient continues to have problems with insomnia, depression, anxiety, excessive daytime sleepiness, joint pain, muscle aches, cramps, memory loss and tremors.  Patient has never had a sleep study.  Mother has sleep apnea.   REVIEW OF SYSTEMS: Full 14 system review of systems performed and negative with exception of: as per HPI.  ALLERGIES: Allergies  Allergen Reactions   Compazine   [Prochlorperazine] Anaphylaxis   Thiethylperazine Anaphylaxis   Vancomycin Rash and Itching    Patient developed localized itching, erythema, swelling.  Onset within 2 min of starting vanc IV.  Appears to be a true vanc allergy and not red-mans syndrome. Patient developed localized itching, erythema, swelling.  Onset within 2 min of starting vanc IV.  Appears to be a true vanc allergy and not red-mans syndrome.    Compazine    Lasix [Furosemide]     BREAKS OUT IN HIVES   Latex     Migraine    HOME MEDICATIONS: Outpatient Medications Prior to Visit  Medication Sig Dispense Refill   acetaminophen (TYLENOL) 500 MG tablet Take 1,000 mg by mouth every 6 (six) hours as needed for headache or mild pain.     albuterol (VENTOLIN HFA) 108 (90 Base) MCG/ACT inhaler Inhale 2 puffs into the lungs every 4 (four) hours as needed for wheezing or shortness of breath. 8 g 0   Aspirin-Salicylamide-Caffeine (BC HEADACHE PO) Take 1 packet by mouth daily as needed (headache/pain).     blood glucose meter kit and supplies Dispense based on patient and insurance preference. Use up to four times daily as directed. (FOR ICD-10 E10.9, E11.9). 1 each 0   buPROPion (WELLBUTRIN XL) 150 MG 24 hr tablet Take 150 mg by mouth every morning.     busPIRone (BUSPAR) 7.5 MG tablet Take 1 tablet (7.5 mg total) by mouth 2 (two) times daily. 60 tablet 2   Dulaglutide 1.5 MG/0.5ML SOPN Inject 1.5 mg into the skin once a week.  fenofibrate (TRICOR) 145 MG tablet Take 1 tablet (145 mg total) by mouth daily. 30 tablet 0   JARDIANCE 10 MG TABS tablet Take 10 mg by mouth daily.     levothyroxine (SYNTHROID) 200 MCG tablet Take 1 tablet (200 mcg total) by mouth daily. 30 tablet 0   meclizine (ANTIVERT) 25 MG tablet Take 1 tablet (25 mg total) by mouth 3 (three) times daily as needed for dizziness. 30 tablet 0   metFORMIN (GLUCOPHAGE-XR) 500 MG 24 hr tablet Take 1,000 mg by mouth in the morning and at bedtime.     naproxen sodium  (ALEVE) 220 MG tablet Take 440 mg by mouth 2 (two) times daily as needed (pain).     ONETOUCH ULTRA test strip USE UP TO 4 TIMES A DAY AS DIRECTED 100 strip 11   rosuvastatin (CRESTOR) 40 MG tablet Take 1 tablet (40 mg total) by mouth daily. 30 tablet 0   SYMBICORT 160-4.5 MCG/ACT inhaler Inhale 2 puffs into the lungs 2 (two) times daily. 1 Inhaler 0   valsartan-hydrochlorothiazide (DIOVAN-HCT) 160-25 MG tablet Take 1 tablet by mouth daily. **PATIENT NEEDS APT FOR FURTHER REFILLS** 30 tablet 0   Vitamin D, Ergocalciferol, (DRISDOL) 1.25 MG (50000 UNIT) CAPS capsule Take 50,000 Units by mouth once a week.     buPROPion (WELLBUTRIN XL) 300 MG 24 hr tablet Take 300 mg by mouth daily.     No facility-administered medications prior to visit.    PAST MEDICAL HISTORY: Past Medical History:  Diagnosis Date   Anxiety and depression    Diabetes mellitus    GERD (gastroesophageal reflux disease)    HTN (hypertension)    Hyperlipidemia    Hypothyroidism    Migraines    Seizure ([place]) 08/14/2020   TIA (transient ischemic attack)     PAST SURGICAL HISTORY: Past Surgical History:  Procedure Laterality Date   Ankle surgery x 2     CHOLECYSTECTOMY     w/hysterectomy   Exploratory Laparoscopy     PARTIAL HYSTERECTOMY     1 ovary remains    FAMILY HISTORY: Family History  Problem Relation Age of Onset   Heart failure Father    Heart attack Father    Diabetes Father    Hyperlipidemia Father    Stroke Maternal Grandmother    Hyperlipidemia Maternal Grandmother    Heart attack Maternal Grandfather    Hyperlipidemia Maternal Grandfather    Stroke Paternal Grandmother    Hyperlipidemia Paternal Grandmother    Heart attack Paternal Grandfather    Hyperlipidemia Paternal Grandfather    Heart attack Paternal Uncle     SOCIAL HISTORY: Social History   Socioeconomic History   Marital status: Legally Separated    Spouse name: Not on file   Number of children: 1   Years of education: Not  on file   Highest education level: Master's degree (e.g., MA, MS, MEng, MEd, MSW, MBA)  Occupational History    Employer: [employer]  Tobacco Use   Smoking status: Former    Packs/day: 1.00    Years: 40.00    Pack years: 40.00    Types: Cigarettes    Quit date: 08/11/2020    Years since quitting: 0.0   Smokeless tobacco: Never  Vaping Use   Vaping Use: Never used  Substance and Sexual Activity   Alcohol use: Not Currently   Drug use: No   Sexual activity: Not Currently  Other Topics Concern   Not on file  Social History Narrative  Lives alone   Caffeine- coffee 2 c daily, one Pepsi   Social Determinants of Health   Financial Resource Strain: Not on file  Food Insecurity: Not on file  Transportation Needs: Not on file  Physical Activity: Not on file  Stress: Not on file  Social Connections: Not on file  Intimate Partner Violence: Not on file     PHYSICAL EXAM  GENERAL EXAM/CONSTITUTIONAL: Vitals:  Vitals:   09/16/20 0812  BP: 127/79  Pulse: 94  Weight: 209 lb 3.2 oz (94.9 kg)  Height: '5\' 3"'  (1.6 m)   Body mass index is 37.06 kg/m. Wt Readings from Last 3 Encounters:  09/16/20 209 lb 3.2 oz (94.9 kg)  08/14/20 214 lb 15.2 oz (97.5 kg)  05/02/20 210 lb (95.3 kg)   Patient is TEARFUL; well developed, nourished and groomed; neck is supple  CARDIOVASCULAR: Examination of carotid arteries is normal; no carotid bruits Regular rate and rhythm, no murmurs Examination of peripheral vascular system by observation and palpation is normal  EYES: Ophthalmoscopic exam of optic discs and posterior segments is normal; no papilledema or hemorrhages No results found.  MUSCULOSKELETAL: Gait, strength, tone, movements noted in Neurologic exam below  NEUROLOGIC: MENTAL STATUS:  No flowsheet data found. awake, alert, oriented to person, place and time recent and remote memory intact normal attention and concentration language fluent, comprehension  intact, naming intact fund of knowledge appropriate  CRANIAL NERVE:  2nd - no papilledema on fundoscopic exam 2nd, 3rd, 4th, 6th - pupils equal and reactive to light, visual fields full to confrontation, extraocular muscles intact, no nystagmus 5th - facial sensation symmetric 7th - facial strength symmetric 8th - hearing intact 9th - palate elevates symmetrically, uvula midline 11th - shoulder shrug symmetric 12th - tongue protrusion midline  MOTOR:  normal bulk and tone, full strength in the BUE, BLE; LEFT HF SLIGHTLY LIMITED BY PAIN MILD POSTURAL AND ACTION TREMOR IN LUE  SENSORY:  normal and symmetric to light touch, temperature, vibration; SLIGHTLY DECR IN LEFT FOOT  COORDINATION:  finger-nose-finger, fine finger movements normal  REFLEXES:  deep tendon reflexes TRACE and symmetric  GAIT/STATION:  narrow based gait     DIAGNOSTIC DATA (LABS, IMAGING, TESTING) - I reviewed patient records, labs, notes, testing and imaging myself where available.  Lab Results  Component Value Date   WBC 9.6 08/14/2020   HGB 14.7 08/14/2020   HCT 44.1 08/14/2020   MCV 93.4 08/14/2020   PLT 253 08/14/2020      Component Value Date/Time   NA 134 (L) 08/14/2020 0802   K 4.6 08/14/2020 0802   CL 101 08/14/2020 0802   CO2 22 08/14/2020 0802   GLUCOSE 359 (H) 08/14/2020 0802   BUN 12 08/14/2020 0802   CREATININE 0.92 08/14/2020 0802   CALCIUM 8.7 (L) 08/14/2020 0802   PROT 6.4 (L) 08/14/2020 0802   ALBUMIN 3.4 (L) 08/14/2020 0802   AST 53 (H) 08/14/2020 0802   ALT 36 08/14/2020 0802   ALKPHOS 79 08/14/2020 0802   BILITOT 1.3 (H) 08/14/2020 0802   GFRNONAA >60 08/14/2020 0802   GFRAA >60 02/10/2019 1339   Lab Results  Component Value Date   CHOL 173 10/17/2010   HDL 30 (L) 10/17/2010   LDLCALC 81 10/17/2010   TRIG 308 (H) 10/17/2010   CHOLHDL 5.8 10/17/2010   Lab Results  Component Value Date   HGBA1C 8.8 (H) 08/14/2020   No results found for: [id number] Lab  Results  Component Value Date   TSH  37.580 (H) 08/14/2020    10/16/2010 MRI brain Premature atrophy with chronic microvascular ischemic change.  Equivocal foci of subcortical white matter infarction right frontal  and left parietal regions.  Consider follow-up 3-5 days when the  patient may be more cooperative and less prone to motion during the  exam.   10/17/2010 MRI brain 1.  No acute infarct.  2.  Nonspecific advanced cerebral white matter signal changes  without enhancement.  Differential considerations include  accelerated/hereditary small vessel ischemia, sequelae of trauma,  hypercoagulable state, vasculitis, migraines, prior infection or  demyelination.  3.  MRA findings are below.  10/17/2010 MRA head and neck -No significant stenosis  08/14/2020 MRI BRAIN [I reviewed images myself and agree with interpretation. -VRP]  - mild chronic small vessel ischemic disease   08/14/2020 EEG -normal  08/15/2020 echocardiogram 1. Left ventricular ejection fraction, by estimation, is 55 to 60%. The  left ventricle has normal function. The left ventricle has no regional  wall motion abnormalities. There is moderate left ventricular hypertrophy.  Left ventricular diastolic  parameters are consistent with Grade I diastolic dysfunction (impaired  relaxation).   2. Right ventricular systolic function is normal. The right ventricular  size is normal. There is normal pulmonary artery systolic pressure. The  estimated right ventricular systolic pressure is 03.8 mmHg.   3. The mitral valve is normal in structure. Trivial mitral valve  regurgitation. No evidence of mitral stenosis.   4. The aortic valve is tricuspid. Aortic valve regurgitation is trivial.  Mild aortic valve sclerosis is present, with no evidence of aortic valve  stenosis.   5. The inferior vena cava is normal in size with greater than 50%  respiratory variability, suggesting right atrial pressure of 3 mmHg.    ASSESSMENT  AND PLAN  55 y.o. year old female here with constellation of symptoms including tremor, double vision, cramps, memory loss, insomnia, joint pains and new onset seizure on 08/14/2020.   Dx:  1. Witnessed seizure-like activity ([place])   2. Excessive daytime sleepiness       PLAN:  NEW ONSET SEIZURE (08/14/20) - possibly related to increased wellbutrin dose (had increased from 379m twice a day to 600g twice a day on her own, 3 days before seizure) - now on wellbutrin 1539mdaily - According to [place] law, you can not drive unless you are seizure / syncope free for at least 6 months and under physician's care. Since this was likely provoked by increased wellbutrin, could shorten time frame to 3 months seizure free. - Please maintain precautions. Do not participate in activities where a loss of awareness could harm you or someone else. No swimming alone, no tub bathing, no hot tubs, no driving, no operating motorized vehicles (cars, ATVs, motocycles, etc), lawnmowers, power tools or firearms. No standing at heights, such as rooftops, ladders or stairs. Avoid hot objects such as stoves, heaters, open fires. Wear a helmet when riding a bicycle, scooter, skateboard, etc. and avoid areas of traffic. Set your water heater to 120 degrees or less.   TREMORS / MEMORY LOSS / DOUBLE VISION - could be related to thyroid dysfunction, insomnia, depression, anxiety, diabetes, HTN, chronic small vessel ischemic disease - continue medical mgmt (HTN, DM, lipids, thyroid dz, smoking cessation) - optimize nutrition and exercise  INSOMNIA / DAYTIME SLEEPINESS / OBESITY - check sleep study  Orders Placed This Encounter  Procedures   Ambulatory referral to Sleep Studies    Return for pending test results, return to PCP, pending  if symptoms worsen or fail to improve.  I reviewed images, labs, notes, records myself. I summarized findings and reviewed with patient, for this high risk condition (new onset seizure)  requiring high complexity decision making.    Penni Bombard, MD 06/04/8880, 8:00 AM Certified in Neurology, Neurophysiology and Neuroimaging  [facility] [address] [place], [place] [address] [phone]

## 2021-01-19 ENCOUNTER — Other Ambulatory Visit: Payer: Self-pay | Admitting: Adult Health

## 2021-02-10 ENCOUNTER — Telehealth (HOSPITAL_COMMUNITY): Payer: Self-pay | Admitting: *Deleted

## 2021-02-10 ENCOUNTER — Encounter (HOSPITAL_COMMUNITY): Payer: Self-pay | Admitting: *Deleted

## 2021-02-10 NOTE — Telephone Encounter (Signed)
Called and left message for pt on voicemail requesting call back.  Would like to review the referral process for cardiac rehab.  She has an upcoming follow up appt scheduled for 2/1. Alanson Aly, BSN Cardiac and Emergency planning/management officer

## 2021-02-10 NOTE — Progress Notes (Signed)
Received referral notification from Dr. 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  Reviewed admission notes from 08/2020 here at [facility] along with follow up progress notes with neurology. Reviewed notes in Care Everywhere along with admission notes.  Pt has scheduled upcoming follow up on 2/1. Sent to MD referral form to complete along with request for 12 lead ekg tracing.  Once requested documents received back and pt has successfully completed their follow up appt on 03/05/21. Will forward to support staff for insurance verification and and scheduling. Alanson Aly, BSN Cardiac and Emergency planning/management officer

## 2021-05-06 ENCOUNTER — Encounter (HOSPITAL_COMMUNITY): Payer: Self-pay

## 2021-05-06 ENCOUNTER — Telehealth (HOSPITAL_COMMUNITY): Payer: Self-pay

## 2021-05-06 NOTE — Telephone Encounter (Signed)
Attempted to call patient in regards to Cardiac Rehab - LM on VM Mailed letter 

## 2021-05-06 NOTE — Telephone Encounter (Signed)
Pt insurance is active and benefits verified through [employer]. Co-pay $0.00, DED $1,250.00/$315.00 met, out of pocket $4,890.00/$935.88 met, co-insurance 20%. No pre-authorization required. Passport, 05/06/21 @ 9:42AM, [id number] ?

## 2021-05-20 ENCOUNTER — Telehealth (HOSPITAL_COMMUNITY): Payer: Self-pay

## 2021-05-20 NOTE — Telephone Encounter (Signed)
No response from pt regarding CR.  Closed referral.  

## 2022-02-28 IMAGING — CT CT HEAD W/O CM
4 series · 16 of 47 positions shown, 18 images · non-contrast
Comparison: Brain MRI 10/17/2010.  Head CT 10/15/2010.

CLINICAL DATA: 55-year-old female with witnessed seizure. Altered
mental status. Fall.

EXAM:
CT HEAD WITHOUT CONTRAST
TECHNIQUE: Contiguous axial images were obtained from the base of the skull
through the vertex without intravenous contrast.

[Series 2: head without · axial · non-contrast · 0.44mm/px · z∈[-67,+53]mm · 7 of 34 slices shown, 9 images]
[im 5/34  brain]
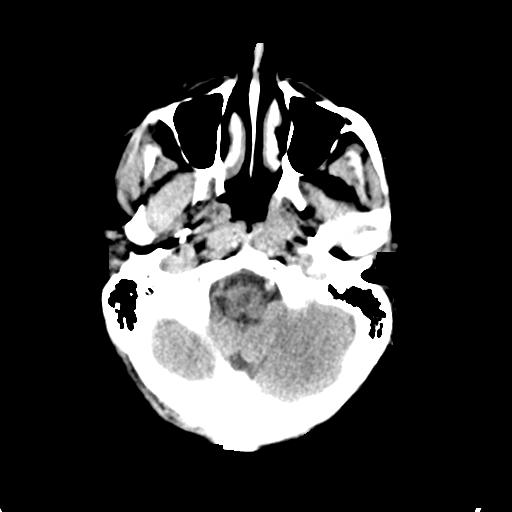
[im 5/34  bone]
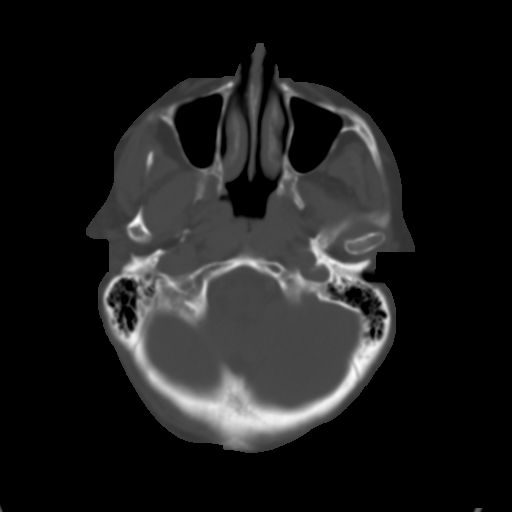
[im 9/34  brain]
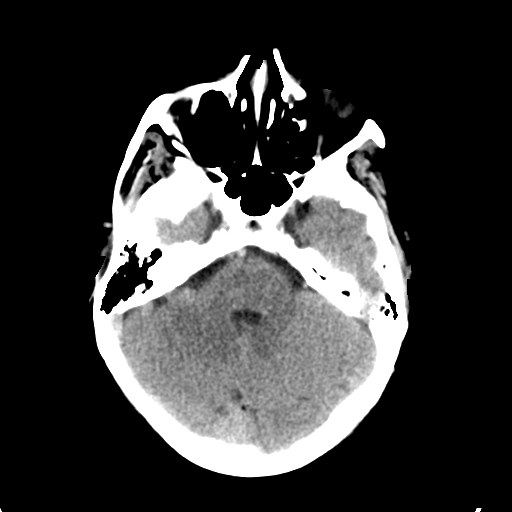
[im 13/34  brain]
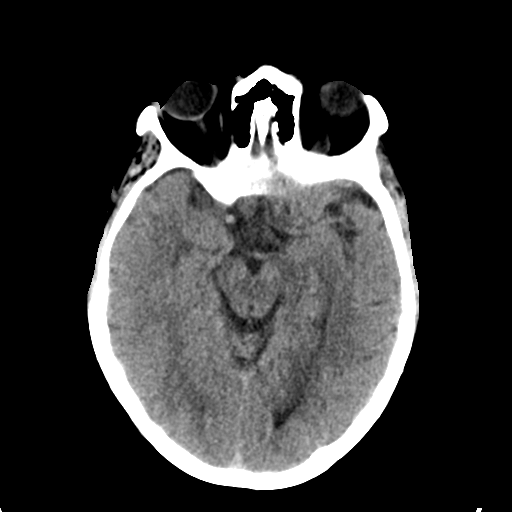
[im 17/34  brain]
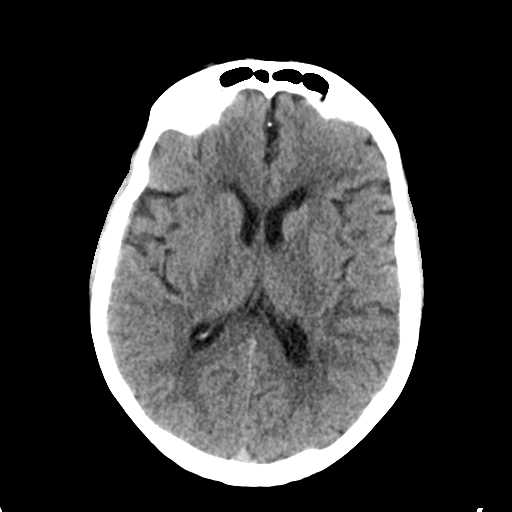
[im 21/34  brain]
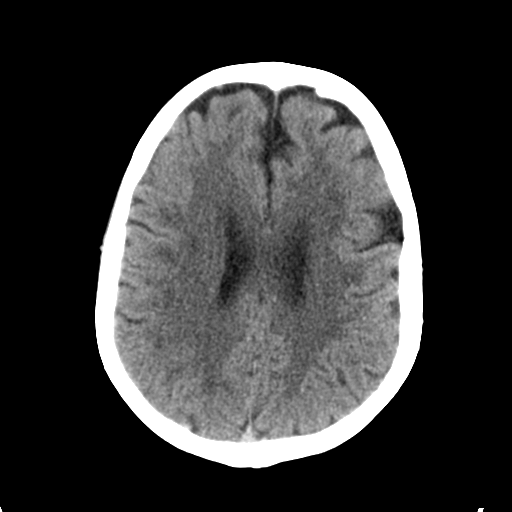
[im 21/34  bone]
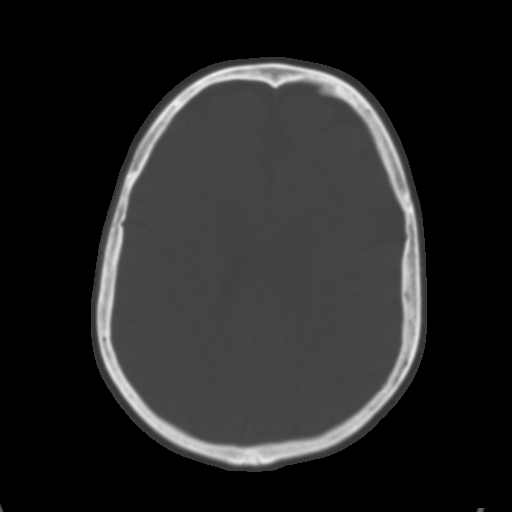
[im 25/34  brain]
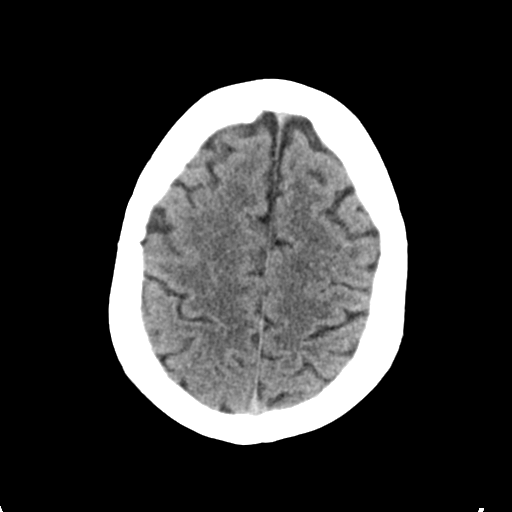
[im 29/34  brain]
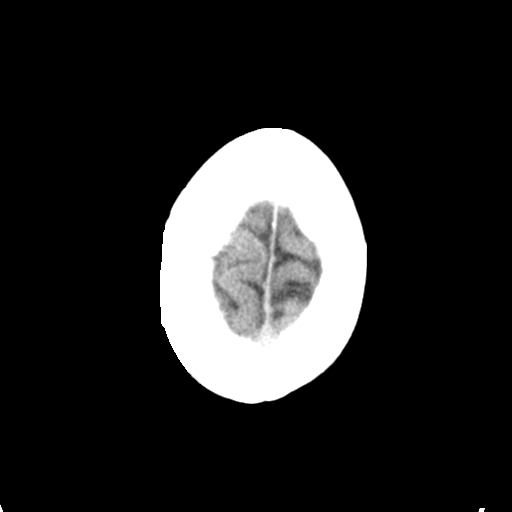

[Series 3: head bone · axial · 0.44mm/px · z∈[-71,-37]mm · 3 of 85 slices shown]
[im 9/85  bone]
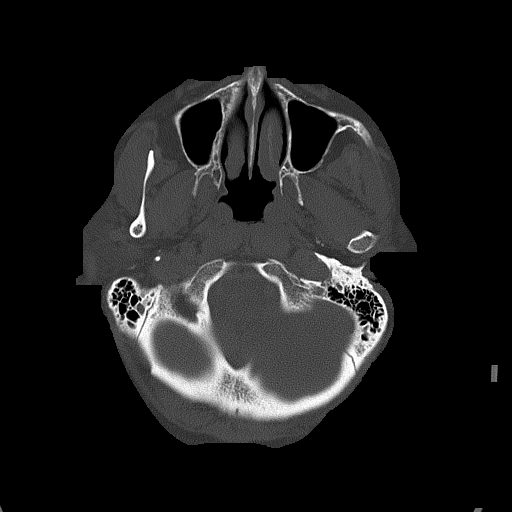
[im 17/85  bone]
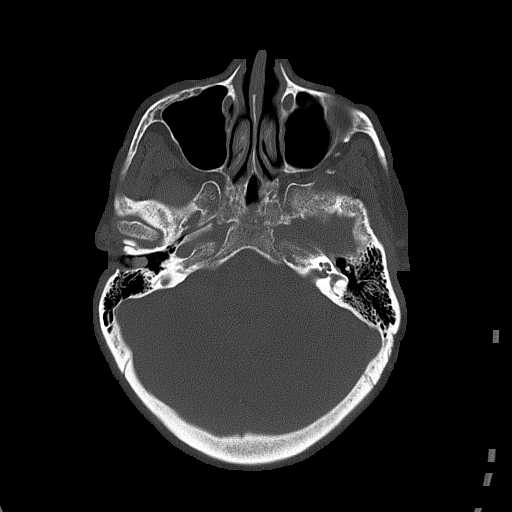
[im 26/85  bone]
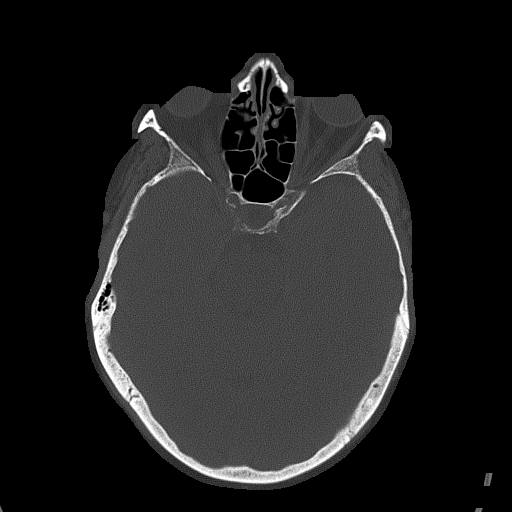

[Series 4: head without sag · sagittal · non-contrast · 0.33mm/px · 3 of 56 slices shown]
[im 19/56  brain]
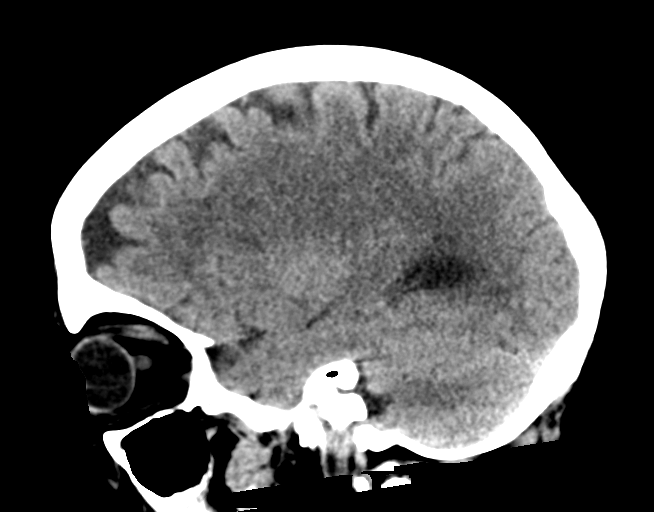
[im 28/56  brain]
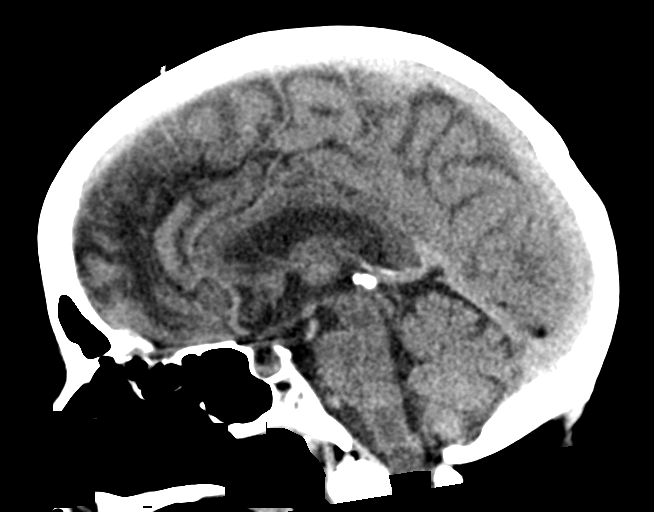
[im 37/56  brain]
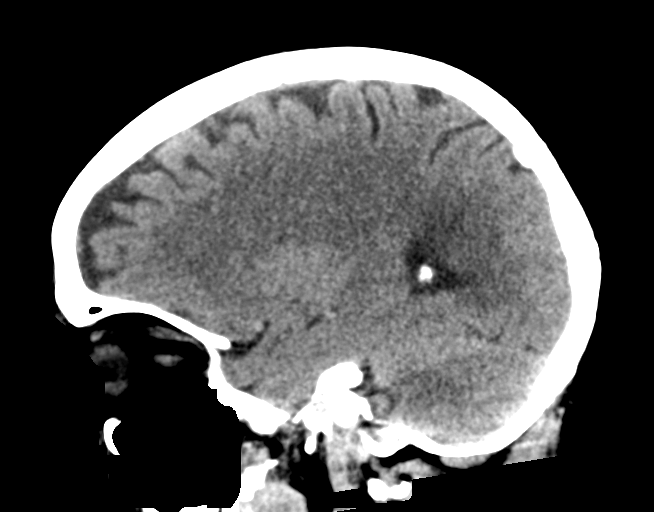

[Series 5: head without cor · coronal · non-contrast · 0.33mm/px · 3 of 67 slices shown]
[im 23/67  brain]
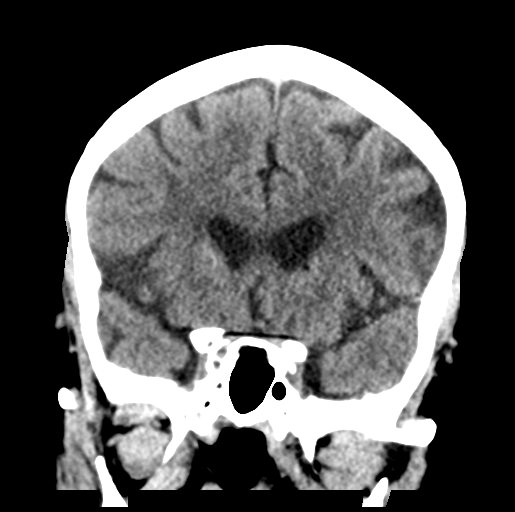
[im 30/67  brain]
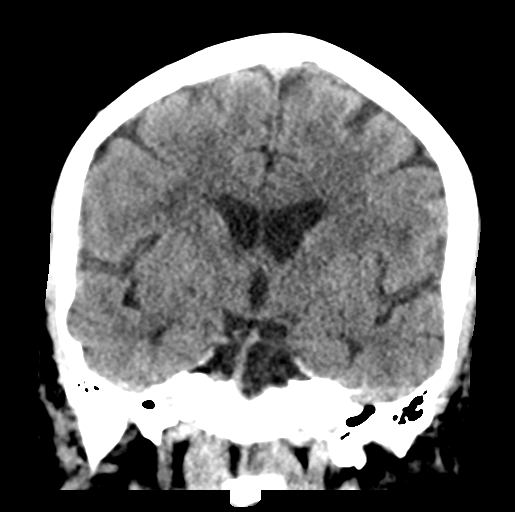
[im 37/67  brain]
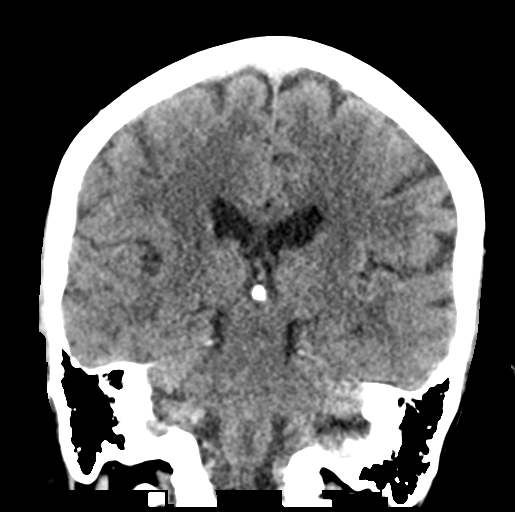

[16 of 47 positions shown; findings below may reference images not displayed]

FINDINGS: Brain: Little cerebral volume loss since 2232. No midline shift,
ventriculomegaly, mass effect, evidence of mass lesion, intracranial
hemorrhage or evidence of cortically based acute infarction.
Advanced bilateral cerebral white matter hypodensity in a patchy,
scattered configuration is fairly symmetric. Deep gray nuclei appear
relatively spared. No cortical encephalomalacia.

Vascular: Mild Calcified atherosclerosis at the skull base. No
suspicious intracranial vascular hyperdensity.

Skull: Stable and intact.

Sinuses/Orbits: Visualized paranasal sinuses and mastoids are stable
and well aerated.

Other: No orbit or scalp soft tissue injury identified.
IMPRESSION: 1. No acute intracranial abnormality or acute traumatic injury
identified.
2. Advanced cerebral white matter disease, nonspecific but most
commonly due to chronic small vessel disease.

## 2022-03-01 IMAGING — DX DG CHEST 1V PORT
1 series · 1 of 1 positions shown · non-contrast
Comparison: 08/14/2020

CLINICAL DATA: Congestive heart failure

EXAM:
PORTABLE CHEST 1 VIEW

[chest ap]
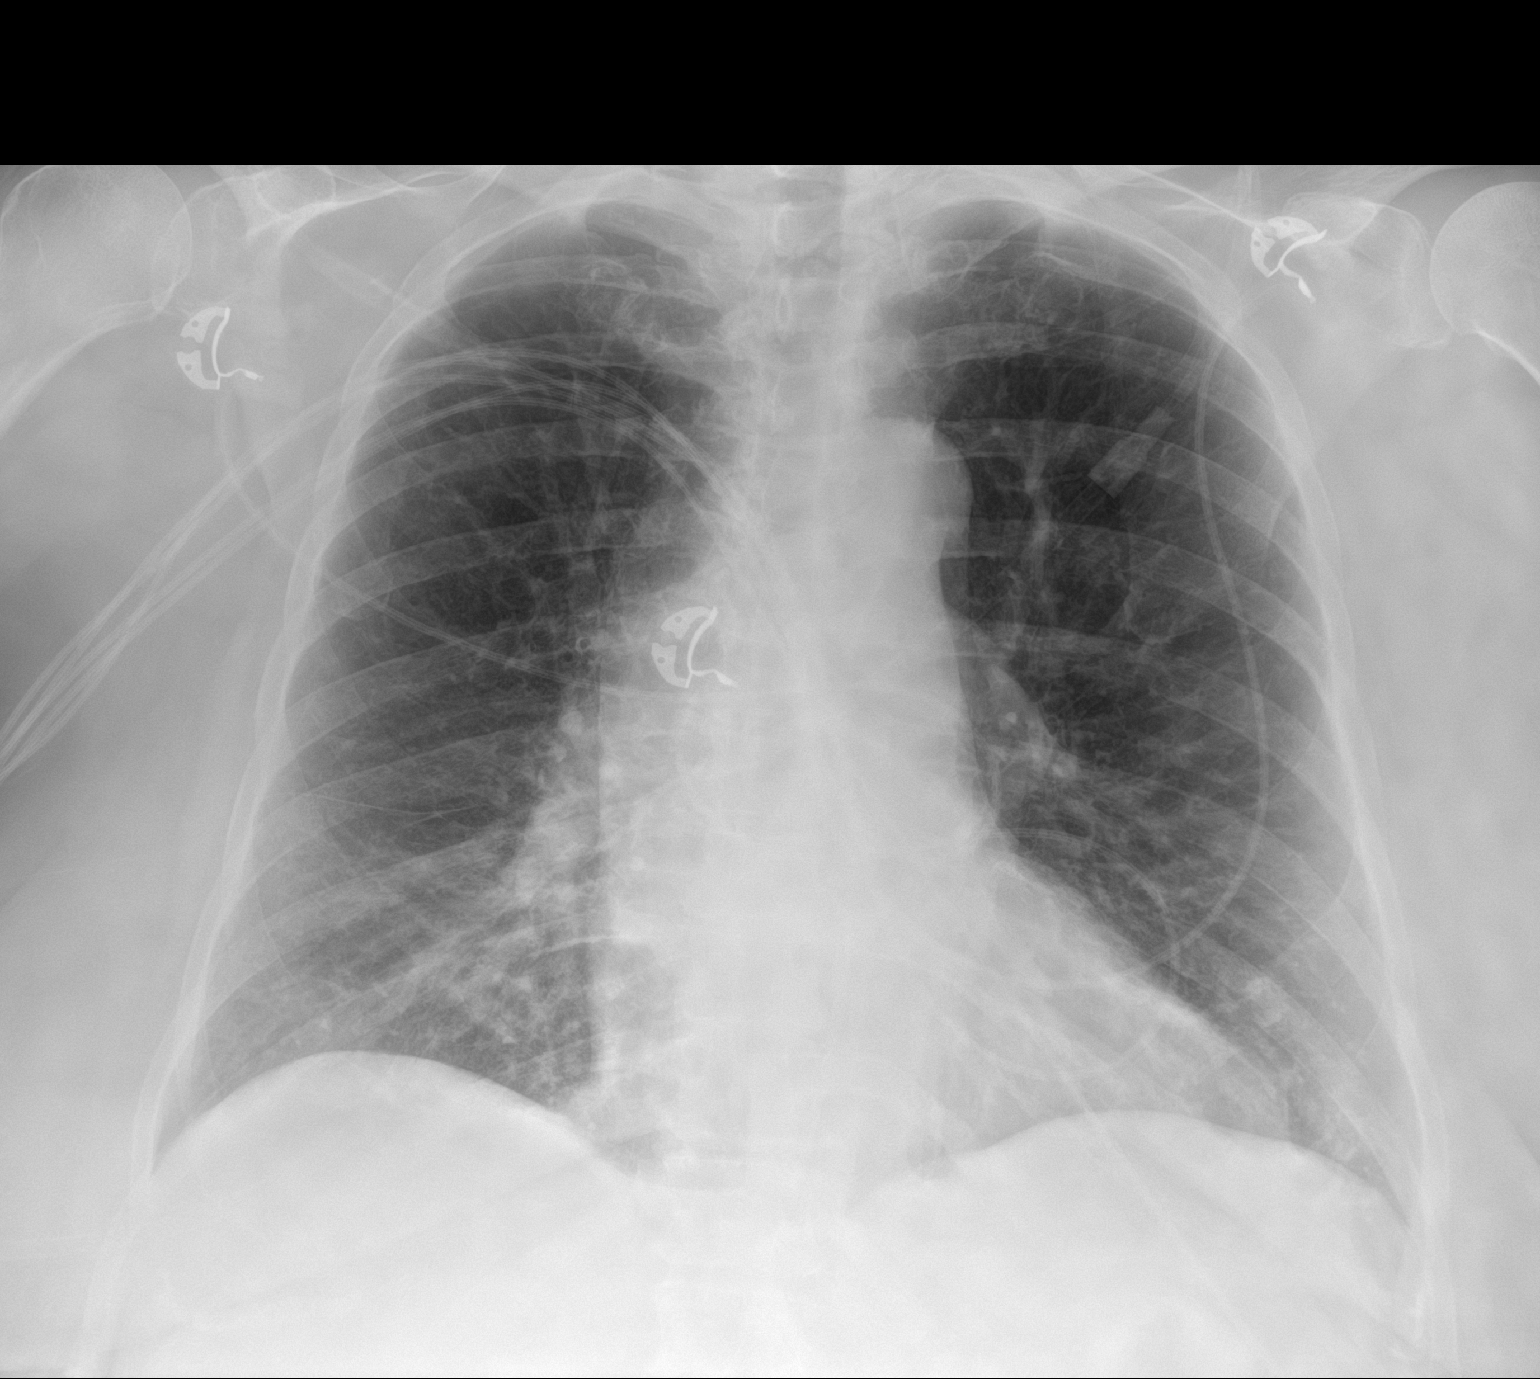

[1 of 1 positions shown; findings below may reference images not displayed]

FINDINGS: Heart size upper limits of normal. Mediastinal shadows are normal.
Pulmonary venous hypertension without interstitial or alveolar
edema. No visible effusion.
IMPRESSION: Pulmonary venous hypertension, similar to yesterday's exam. No
edema.

## 2022-06-04 HISTORY — PX: CAROTID STENT: SHX1301

## 2022-08-27 LAB — HM COLONOSCOPY

## 2023-02-02 ENCOUNTER — Other Ambulatory Visit: Payer: Self-pay | Admitting: Family Medicine

## 2023-02-02 DIAGNOSIS — E7849 Other hyperlipidemia: Secondary | ICD-10-CM

## 2023-05-16 ENCOUNTER — Encounter: Payer: Self-pay | Admitting: Emergency Medicine

## 2023-05-16 ENCOUNTER — Other Ambulatory Visit: Payer: Self-pay

## 2023-05-16 ENCOUNTER — Emergency Department
Admission: EM | Admit: 2023-05-16 | Discharge: 2023-05-17 | Disposition: A | Discharge status: Home / Self Care | Payer: Self-pay | Attending: Emergency Medicine | Admitting: Emergency Medicine

## 2023-05-16 ENCOUNTER — Emergency Department: Payer: Self-pay

## 2023-05-16 DIAGNOSIS — I10 Essential (primary) hypertension: Secondary | ICD-10-CM | POA: Insufficient documentation

## 2023-05-16 DIAGNOSIS — S8992XA Unspecified injury of left lower leg, initial encounter: Secondary | ICD-10-CM | POA: Diagnosis present

## 2023-05-16 DIAGNOSIS — E119 Type 2 diabetes mellitus without complications: Secondary | ICD-10-CM | POA: Diagnosis not present

## 2023-05-16 DIAGNOSIS — W268XXA Contact with other sharp object(s), not elsewhere classified, initial encounter: Secondary | ICD-10-CM | POA: Insufficient documentation

## 2023-05-16 DIAGNOSIS — S81802A Unspecified open wound, left lower leg, initial encounter: Secondary | ICD-10-CM | POA: Insufficient documentation

## 2023-05-16 LAB — CBC WITH DIFFERENTIAL/PLATELET
Abs Immature Granulocytes: 0.01 10*3/uL (ref 0.00–0.07)
Basophils Absolute: 0.1 10*3/uL (ref 0.0–0.1)
Basophils Relative: 1 %
Eosinophils Absolute: 0.3 10*3/uL (ref 0.0–0.5)
Eosinophils Relative: 4 %
HCT: 40.1 % (ref 36.0–46.0)
Hemoglobin: 13.4 g/dL (ref 12.0–15.0)
Immature Granulocytes: 0 %
Lymphocytes Relative: 30 %
Lymphs Abs: 1.9 10*3/uL (ref 0.7–4.0)
MCH: 31.4 pg (ref 26.0–34.0)
MCHC: 33.4 g/dL (ref 30.0–36.0)
MCV: 93.9 fL (ref 80.0–100.0)
Monocytes Absolute: 0.6 10*3/uL (ref 0.1–1.0)
Monocytes Relative: 9 %
Neutro Abs: 3.6 10*3/uL (ref 1.7–7.7)
Neutrophils Relative %: 56 %
Platelets: 236 10*3/uL (ref 150–400)
RBC: 4.27 MIL/uL (ref 3.87–5.11)
RDW: 14.3 % (ref 11.5–15.5)
WBC: 6.5 10*3/uL (ref 4.0–10.5)
nRBC: 0 % (ref 0.0–0.2)

## 2023-05-16 LAB — BASIC METABOLIC PANEL WITH GFR
Anion gap: 10 (ref 5–15)
BUN: 23 mg/dL — ABNORMAL HIGH (ref 6–20)
CO2: 26 mmol/L (ref 22–32)
Calcium: 9.2 mg/dL (ref 8.9–10.3)
Chloride: 104 mmol/L (ref 98–111)
Creatinine, Ser: 0.77 mg/dL (ref 0.44–1.00)
GFR, Estimated: >60 mL/min (ref >60)
Glucose, Bld: 166 mg/dL — ABNORMAL HIGH (ref 70–99)
Potassium: 3.6 mmol/L (ref 3.5–5.1)
Sodium: 140 mmol/L (ref 135–145)

## 2023-05-16 MED ORDER — HYDROCHLOROTHIAZIDE 25 MG PO TABS
25.0000 mg | ORAL_TABLET | ORAL | Status: AC
  Administered 2023-05-17: 25 mg via ORAL
  Filled 2023-05-16: qty 1

## 2023-05-16 MED ORDER — IRBESARTAN 150 MG PO TABS
150.0000 mg | ORAL_TABLET | ORAL | Status: AC
  Administered 2023-05-17: 150 mg via ORAL
  Filled 2023-05-16: qty 1

## 2023-05-16 NOTE — ED Triage Notes (Addendum)
 Pt c/o injury left lower leg while moving, now has redness, swelling and discoloration to toes, hx of DM II, pt further c/o HTN, pt reports missed doses of HTN medication this week during moving

## 2023-05-16 NOTE — ED Provider Notes (Signed)
 [facility] Provider Note    Event Date/Time   First MD Initiated Contact with Patient 05/16/23 2328     (approximate)   History   Leg Injury and Hypertension   HPI [name] is a 58 y.o. female  who presents with an injury to her left lower leg.  About 8 days ago she struck the middle of her left shin on a step.  She had a small laceration and some swelling and bruising.  It seemed to be improving and the swelling has gone down and there is a scab where the skin was broken, but tonight she noticed that there is some bruising in her foot.  Her daughter convinced her to come in for evaluation.  She says she can walk on it without any pain or tenderness.  It is not hot or sore to the touch.  It has improved over time.  She was little bit concerned because she is diabetic.  Of note, she has been undergoing a lot of stress and has been moving recently and has missed several doses of her blood pressure medicine.  She has no headache, visual changes, chest pain, nor shortness of breath.  No nausea or vomiting, normal eating and drinking.     Physical Exam   Triage Vital Signs: ED Triage Vitals  Encounter Vitals Group     BP 05/16/23 2057 (!) 215/108     Systolic BP Percentile --      Diastolic BP Percentile --      Pulse Rate 05/16/23 2057 88     Resp 05/16/23 2057 20     Temp 05/16/23 2057 98 F (36.7 C)     Temp Source 05/16/23 2057 Oral     SpO2 05/16/23 2057 99 %     Weight 05/16/23 2058 90.7 kg (200 lb)     Height 05/16/23 2058 1.6 m (5\' 3" )     Head Circumference --      Peak Flow --      Pain Score 05/16/23 2113 0     Pain Loc --      Pain Education --      Exclude from Growth Chart --     Most recent vital signs: Vitals:   05/16/23 2057 05/17/23 0110  BP: (!) 215/108 (!) 162/84  Pulse: 88 70  Resp: 20 17  Temp: 98 F (36.7 C) 97.8 F (36.6 C)  SpO2: 99% 96%    General: Awake, no distress.  Well-appearing. CV:  Good  peripheral perfusion.  Regular rate and rhythm. Resp:  Normal effort. Speaking easily and comfortably, no accessory muscle usage nor intercostal retractions.   Abd:  No distention.  Other:  Patient has a subacute wound to the middle of her left anterior shin.  There is a scab with some surrounding hematoma but no evidence of infection.  Nontender, not particularly warm to the touch but the patient has adequate perfusion down to her toes.  She has some dependent edema/bruising in her foot and ankle that is nontender with normal range of motion, consistent with the fact that she is on Plavix and I believe had some bleeding and bruising that went to her foot and is healing over time.  The injuries appear nonacute.   ED Results / Procedures / Treatments   Labs (all labs ordered are listed, but only abnormal results are displayed) Labs Reviewed  BASIC METABOLIC PANEL WITH GFR - Abnormal; Notable for the following components:  Result Value   Glucose, Bld 166 (*)    BUN 23 (*)    All other components within normal limits  CBC WITH DIFFERENTIAL/PLATELET     RADIOLOGY I viewed and interpreted the patient's x-rays and I see no evidence of acute fracture.  Radiologist mentioned the pre-existing hardware and some soft tissue swelling.   PROCEDURES:  Critical Care performed: No  Procedures    IMPRESSION / MDM / ASSESSMENT AND PLAN / ED COURSE  I reviewed the triage vital signs and the nursing notes.                              Differential diagnosis includes, but is not limited to, fracture, hematoma, bruising, cellulitis.  Patient's presentation is most consistent with acute presentation with potential threat to life or bodily function.  Labs/studies ordered: Tibia/fibula x-rays, BMP, CBC with differential  Interventions/Medications given:  Medications  irbesartan (AVAPRO) tablet 150 mg (150 mg Oral Given 05/17/23 0001)  hydrochlorothiazide (HYDRODIURIL) tablet 25 mg (25 mg  Oral Given 05/17/23 0000)    (Note:  hospital course my include additional interventions and/or labs/studies not listed above.)   Reassuring physical exam, healing wound, provided reassurance.  No indication for further intervention.  Her blood pressure was extremely elevated in triage which is likely situational but also related to missing some doses of her medicines and her underlying essential hypertension.  She is asymptomatic from this.  She has multiple medications at home that she can take and I stressed the importance of taking them regularly.  I gave her a dose of Avapro and HCTZ here in the emergency department, but I had my usual and customary uncontrolled hypertension discussion with her.  She would prefer to take her regular medicines and to follow-up with her primary or establish local care.  I think that is reasonable rather than starting her on additional medications since she is already on medicines and has had difficulty recently with adherence to her regimen.  She will follow-up as an outpatient.  I gave my usual customary return precautions.         FINAL CLINICAL IMPRESSION(S) / ED DIAGNOSES   Final diagnoses:  Injury of left lower leg, initial encounter  Uncontrolled hypertension     Rx / DC Orders   ED Discharge Orders          Ordered    Ambulatory Referral to Primary Care (Establish Care)        05/17/23 0108             Note:  This document was prepared using Dragon voice recognition software and may include unintentional dictation errors.   Lynnda Sas, MD 05/17/23 [id number]

## 2023-05-17 NOTE — Discharge Instructions (Addendum)
 Your blood pressure was very high tonight.  This may just be because of the situation, but it is important that you follow-up with your primary care provider to have your blood pressure rechecked and determine if you need any new blood pressure medicines or any changes to your existing medications.  You have been referred to the local outpatient primary care provider service, and someone should reach out to you soon about establishing local care.  Regarding your leg injury, your x-rays were reassuring with no sign of fracture, and your bruising and hematoma should continue improving over time.  Read through the included information and follow the recommendations.      Return to the emergency department if you develop new or worsening symptoms that concern you.

## 2023-07-09 ENCOUNTER — Encounter: Attending: Physician Assistant | Admitting: Physician Assistant

## 2023-07-09 DIAGNOSIS — S8012XA Contusion of left lower leg, initial encounter: Secondary | ICD-10-CM | POA: Insufficient documentation

## 2023-07-09 DIAGNOSIS — X58XXXA Exposure to other specified factors, initial encounter: Secondary | ICD-10-CM | POA: Insufficient documentation

## 2023-07-09 DIAGNOSIS — S81812A Laceration without foreign body, left lower leg, initial encounter: Secondary | ICD-10-CM | POA: Insufficient documentation

## 2023-07-09 DIAGNOSIS — E038 Other specified hypothyroidism: Secondary | ICD-10-CM | POA: Insufficient documentation

## 2023-07-09 DIAGNOSIS — E11622 Type 2 diabetes mellitus with other skin ulcer: Secondary | ICD-10-CM | POA: Diagnosis not present

## 2023-07-09 DIAGNOSIS — I10 Essential (primary) hypertension: Secondary | ICD-10-CM | POA: Insufficient documentation

## 2023-07-09 DIAGNOSIS — L97822 Non-pressure chronic ulcer of other part of left lower leg with fat layer exposed: Secondary | ICD-10-CM | POA: Diagnosis not present

## 2023-07-16 ENCOUNTER — Encounter: Admitting: Physician Assistant

## 2023-07-16 DIAGNOSIS — S81812A Laceration without foreign body, left lower leg, initial encounter: Secondary | ICD-10-CM | POA: Diagnosis not present

## 2023-07-19 ENCOUNTER — Encounter

## 2023-07-19 DIAGNOSIS — S81812A Laceration without foreign body, left lower leg, initial encounter: Secondary | ICD-10-CM | POA: Diagnosis not present

## 2023-07-23 ENCOUNTER — Ambulatory Visit: Admitting: Internal Medicine

## 2023-07-26 ENCOUNTER — Encounter: Admitting: Physician Assistant

## 2023-07-26 DIAGNOSIS — S81812A Laceration without foreign body, left lower leg, initial encounter: Secondary | ICD-10-CM | POA: Diagnosis not present

## 2023-08-03 ENCOUNTER — Encounter: Attending: Physician Assistant | Admitting: Physician Assistant

## 2023-08-03 DIAGNOSIS — S81812A Laceration without foreign body, left lower leg, initial encounter: Secondary | ICD-10-CM | POA: Insufficient documentation

## 2023-08-03 DIAGNOSIS — E039 Hypothyroidism, unspecified: Secondary | ICD-10-CM | POA: Insufficient documentation

## 2023-08-03 DIAGNOSIS — I10 Essential (primary) hypertension: Secondary | ICD-10-CM | POA: Diagnosis not present

## 2023-08-03 DIAGNOSIS — E11622 Type 2 diabetes mellitus with other skin ulcer: Secondary | ICD-10-CM | POA: Insufficient documentation

## 2023-08-03 DIAGNOSIS — S8012XA Contusion of left lower leg, initial encounter: Secondary | ICD-10-CM | POA: Insufficient documentation

## 2023-08-03 DIAGNOSIS — L97822 Non-pressure chronic ulcer of other part of left lower leg with fat layer exposed: Secondary | ICD-10-CM | POA: Diagnosis not present

## 2023-08-04 ENCOUNTER — Encounter: Payer: Self-pay | Admitting: Family Medicine

## 2023-08-04 ENCOUNTER — Ambulatory Visit: Admitting: Family Medicine

## 2023-08-04 VITALS — BP 161/104 | HR 62 | Resp 14 | Ht 63.0 in | Wt 217.0 lb

## 2023-08-04 DIAGNOSIS — Z1231 Encounter for screening mammogram for malignant neoplasm of breast: Secondary | ICD-10-CM

## 2023-08-04 DIAGNOSIS — N181 Chronic kidney disease, stage 1: Secondary | ICD-10-CM

## 2023-08-04 DIAGNOSIS — E1169 Type 2 diabetes mellitus with other specified complication: Secondary | ICD-10-CM

## 2023-08-04 DIAGNOSIS — Z23 Encounter for immunization: Secondary | ICD-10-CM | POA: Diagnosis not present

## 2023-08-04 DIAGNOSIS — Z79899 Other long term (current) drug therapy: Secondary | ICD-10-CM

## 2023-08-04 DIAGNOSIS — Z7689 Persons encountering health services in other specified circumstances: Secondary | ICD-10-CM

## 2023-08-04 DIAGNOSIS — E1140 Type 2 diabetes mellitus with diabetic neuropathy, unspecified: Secondary | ICD-10-CM | POA: Diagnosis not present

## 2023-08-04 DIAGNOSIS — R0683 Snoring: Secondary | ICD-10-CM

## 2023-08-04 DIAGNOSIS — F411 Generalized anxiety disorder: Secondary | ICD-10-CM

## 2023-08-04 DIAGNOSIS — I2510 Atherosclerotic heart disease of native coronary artery without angina pectoris: Secondary | ICD-10-CM

## 2023-08-04 DIAGNOSIS — E559 Vitamin D deficiency, unspecified: Secondary | ICD-10-CM

## 2023-08-04 DIAGNOSIS — K754 Autoimmune hepatitis: Secondary | ICD-10-CM

## 2023-08-04 DIAGNOSIS — E039 Hypothyroidism, unspecified: Secondary | ICD-10-CM

## 2023-08-04 DIAGNOSIS — E1159 Type 2 diabetes mellitus with other circulatory complications: Secondary | ICD-10-CM | POA: Diagnosis not present

## 2023-08-04 DIAGNOSIS — Z955 Presence of coronary angioplasty implant and graft: Secondary | ICD-10-CM

## 2023-08-04 DIAGNOSIS — F17211 Nicotine dependence, cigarettes, in remission: Secondary | ICD-10-CM

## 2023-08-04 DIAGNOSIS — J449 Chronic obstructive pulmonary disease, unspecified: Secondary | ICD-10-CM

## 2023-08-04 MED ORDER — SYMBICORT 160-4.5 MCG/ACT IN AERO: 2.0000 | INHALATION_SPRAY | Freq: Two times a day (BID) | RESPIRATORY_TRACT | 0 refills | Status: AC

## 2023-08-04 MED ORDER — VALSARTAN-HYDROCHLOROTHIAZIDE 160-25 MG PO TABS: 1.0000 | ORAL_TABLET | Freq: Every day | ORAL | 0 refills | Status: DC

## 2023-08-04 MED ORDER — CARVEDILOL 3.125 MG PO TABS: 3.1250 mg | ORAL_TABLET | Freq: Two times a day (BID) | ORAL | 3 refills | Status: AC

## 2023-08-04 MED ORDER — CLOPIDOGREL BISULFATE 75 MG PO TABS: 75.0000 mg | ORAL_TABLET | Freq: Every day | ORAL | 3 refills | Status: AC

## 2023-08-04 MED ORDER — SYNJARDY XR 12.5-1000 MG PO TB24: 1.0000 | ORAL_TABLET | Freq: Every day | ORAL | 3 refills | Status: AC

## 2023-08-04 MED ORDER — SERTRALINE HCL 50 MG PO TABS: 50.0000 mg | ORAL_TABLET | Freq: Every day | ORAL | 3 refills | Status: AC

## 2023-08-04 MED ORDER — ALBUTEROL SULFATE HFA 108 (90 BASE) MCG/ACT IN AERS: 2.0000 | INHALATION_SPRAY | RESPIRATORY_TRACT | 0 refills | Status: AC | PRN

## 2023-08-04 MED ORDER — ASPIRIN 81 MG PO TBEC: 81.0000 mg | DELAYED_RELEASE_TABLET | Freq: Every day | ORAL | 3 refills | Status: AC

## 2023-08-04 MED ORDER — LEVOTHYROXINE SODIUM 175 MCG PO TABS: 175.0000 ug | ORAL_TABLET | Freq: Every day | ORAL | 3 refills | Status: AC

## 2023-08-04 MED ORDER — TIRZEPATIDE 2.5 MG/0.5ML ~~LOC~~ SOAJ: 2.5000 mg | SUBCUTANEOUS | 0 refills | Status: DC

## 2023-08-04 MED ORDER — ROSUVASTATIN CALCIUM 40 MG PO TABS
40.0000 mg | ORAL_TABLET | Freq: Every day | ORAL | 3 refills | Status: AC
Start: 2023-08-04 — End: ?

## 2023-08-04 MED ORDER — TIRZEPATIDE 5 MG/0.5ML ~~LOC~~ SOAJ: 5.0000 mg | SUBCUTANEOUS | 0 refills | Status: DC

## 2023-08-04 NOTE — Progress Notes (Signed)
 New patient visit   Patient: [name]   DOB: 21-Oct-1965   58 y.o. Female  MRN: [id number] Visit Date: 08/04/2023  Today's healthcare provider: LAURAINE LOISE BUOY, DO   Chief Complaint  Patient presents with   Establish Care    Dr lauraine at [facility]. [facility] Last mammogram: due Last colonoscopy: 2024 Eye exam: will schedule next week   Subjective    [name] is a 58 y.o. female who presents today as a new patient to establish care.  HPI HPI     Establish Care    Additional comments: Dr lauraine at [facility]. [facility] Last mammogram: due Last colonoscopy: 2024 Eye exam: will schedule next week      Last edited by Wilfred Hargis RAMAN, CMA on 08/04/2023  1:35 PM.      [name] is a 58 year old female with diabetes and hypertension who presents for medication refills and management of her chronic conditions.  She has been without her diabetes medications, Mounjaro  and Synjardy -XR, for approximately three weeks due to a prescription issue. Her previous provider was supposed to call in the prescription to [facility], but it was not done. She has been on some form of metformin  for many years and was previously on Mounjaro , which was effective at a higher dose, but there were insurance issues with the dosage.  She has a history of thyroid  dysfunction and is currently taking levothyroxine  175 mcg daily. She also takes blood pressure medication (carvedilol  and valsartan -hydrochlorothiazide ) and has a history of elevated liver enzymes, with a past liver biopsy showing mild chronic active hepatitis with steatosis, identified as autoimmune hepatitis.  She has a history of coronary artery disease with a stent placement and is on a blood thinner, Plavix , as well as carvedilol  twice daily. She also takes rosuvastatin  40 mg for cholesterol management. She has a history of mini-strokes, with the last incident occurring over five years ago.  She experiences  insomnia and is interested in a sleep study. She quit smoking three years ago after smoking for 40 years and has had difficulty sleeping since quitting. She also experiences numbness and tingling in her feet due to diabetes but can still feel her feet. She has a wound on her leg from a fall, which is being treated at a wound care clinic.  She has a history of anxiety and depression and is interested in medication for these conditions. She previously took sertraline . She has occasional thoughts of self-harm but denies any current intent or plan. She also has a history of IBS and COPD for which uses albuterol  as needed for shortness of breath, especially during physical exertion.  She has a history of vitamin D  deficiency and reports no issues with B12. She has not had an eye exam in the past year. She has not had a recent colonoscopy but believes her last one was in 2024. She is interested in receiving the shingles vaccine and plans to get the COVID booster closer to the school year.     Past Medical History:  Diagnosis Date   Allergy    Anxiety and depression    Arthritis    COPD (chronic obstructive pulmonary disease) (HCC)    Diabetes mellitus    GERD (gastroesophageal reflux disease)    HTN (hypertension)    Hyperlipidemia    Hypothyroidism    Migraines    TIA (transient ischemic attack)    Past Surgical History:  Procedure Laterality  Date   ABDOMINAL HYSTERECTOMY     Ankle surgery x 2     CAROTID STENT  06/04/2022   Mid RCA   CHOLECYSTECTOMY     w/hysterectomy   Exploratory Laparoscopy     FRACTURE SURGERY     PARTIAL HYSTERECTOMY     1 ovary remains   Family Status  Relation Name Status   Mother  Alive   Father  Deceased   Sister  Alive   MGM  Deceased   MGF  Deceased   PGM  Deceased   PGF  Deceased   Nutritional therapist  (Not Specified)  No partnership data on file   Family History  Problem Relation Age of Onset   Heart failure Father    Heart attack Father    Diabetes  Father    Hyperlipidemia Father    Stroke Maternal Grandmother    Hyperlipidemia Maternal Grandmother    Heart attack Maternal Grandfather    Hyperlipidemia Maternal Grandfather    Stroke Paternal Grandmother    Hyperlipidemia Paternal Grandmother    Heart attack Paternal Grandfather    Hyperlipidemia Paternal Grandfather    Heart attack Paternal Uncle    Social History   Socioeconomic History   Marital status: Legally Separated    Spouse name: Not on file   Number of children: 1   Years of education: Not on file   Highest education level: Master's degree (e.g., MA, MS, MEng, MEd, MSW, MBA)  Occupational History    Employer: [employer]  Tobacco Use   Smoking status: Former    Current packs/day: 0.00    Average packs/day: 1 pack/day for 40.0 years (40.0 ttl pk-yrs)    Types: Cigarettes    Start date: 08/11/1980    Quit date: 08/11/2020    Years since quitting: 2.9   Smokeless tobacco: Never  Vaping Use   Vaping status: Never Used  Substance and Sexual Activity   Alcohol use: Not Currently   Drug use: No   Sexual activity: Not Currently  Other Topics Concern   Not on file  Social History Narrative   Lives alone   Caffeine - coffee 2 c daily, one Pepsi   Social Drivers of Health   Financial Resource Strain: High Risk (08/04/2023)   Overall Financial Resource Strain (CARDIA)    Difficulty of Paying Living Expenses: Hard  Food Insecurity: Food Insecurity Present (08/04/2023)   Hunger Vital Sign    Worried About Running Out of Food in the Last Year: Never true    Ran Out of Food in the Last Year: Sometimes true  Transportation Needs: Unmet Transportation Needs (08/04/2023)   PRAPARE - Transportation    Lack of Transportation (Medical): No    Lack of Transportation (Non-Medical): Yes  Physical Activity: Patient Declined (08/04/2023)   Exercise Vital Sign    Days of Exercise per Week: Patient declined    Minutes of Exercise per Session: Patient declined  Stress:  Stress Concern Present (08/04/2023)   [employer] of Occupational Health - Occupational Stress Questionnaire    Feeling of Stress: Very much  Social Connections: Somewhat Isolated (02/23/2022)   Received from [facility]   Social Network    How would you rate your social network (family, work, friends)?: Restricted participation with some degree of social isolation   Outpatient Medications Prior to Visit  Medication Sig   blood glucose meter kit and supplies Dispense based on patient and insurance preference. Use up to four times daily as directed. (  FOR ICD-10 E10.9, E11.9).   nitroGLYCERIN (NITROSTAT) 0.4 MG SL tablet Place 0.4 mg under the tongue.   ONETOUCH ULTRA test strip USE UP TO 4 TIMES A DAY AS DIRECTED   Vitamin D , Ergocalciferol , (DRISDOL) 1.25 MG (50000 UNIT) CAPS capsule Take 50,000 Units by mouth once a week.   [DISCONTINUED] albuterol  (VENTOLIN  HFA) 108 (90 Base) MCG/ACT inhaler Inhale 2 puffs into the lungs every 4 (four) hours as needed for wheezing or shortness of breath.   [DISCONTINUED] aspirin  EC 81 MG tablet Take 81 mg by mouth.   [DISCONTINUED] clopidogrel  (PLAVIX ) 75 MG tablet Take 75 mg by mouth.   [DISCONTINUED] Empagliflozin -metFORMIN  HCl ER (SYNJARDY  XR) 12.06-998 MG TB24 Take 1 tablet by mouth.   [DISCONTINUED] levothyroxine  (SYNTHROID ) 200 MCG tablet Take 1 tablet (200 mcg total) by mouth daily. (Patient taking differently: Take 175 mcg by mouth daily.)   [DISCONTINUED] MOUNJARO  5 MG/0.5ML Pen Inject 5 mg into the skin.   [DISCONTINUED] rosuvastatin  (CRESTOR ) 40 MG tablet Take 1 tablet (40 mg total) by mouth daily.   [DISCONTINUED] sertraline  (ZOLOFT ) 50 MG tablet Take 50 mg by mouth.   [DISCONTINUED] SYMBICORT  160-4.5 MCG/ACT inhaler Inhale 2 puffs into the lungs 2 (two) times daily.   [DISCONTINUED] valsartan -hydrochlorothiazide  (DIOVAN -HCT) 160-25 MG tablet Take 1 tablet by mouth daily. **PATIENT NEEDS APT FOR FURTHER REFILLS**   [DISCONTINUED]  acetaminophen  (TYLENOL ) 500 MG tablet Take 1,000 mg by mouth every 6 (six) hours as needed for headache or mild pain.   [DISCONTINUED] Aspirin -Salicylamide-Caffeine  (BC HEADACHE PO) Take 1 packet by mouth daily as needed (headache/pain).   [DISCONTINUED] buPROPion  (WELLBUTRIN  XL) 150 MG 24 hr tablet Take 150 mg by mouth every morning.   [DISCONTINUED] busPIRone  (BUSPAR ) 7.5 MG tablet Take 1 tablet (7.5 mg total) by mouth 2 (two) times daily.   [DISCONTINUED] carvedilol  (COREG ) 3.125 MG tablet Take 3.125 mg by mouth 2 (two) times daily.   [DISCONTINUED] fenofibrate  (TRICOR ) 145 MG tablet Take 1 tablet (145 mg total) by mouth daily.   [DISCONTINUED] JARDIANCE  10 MG TABS tablet Take 10 mg by mouth daily.   [DISCONTINUED] meclizine  (ANTIVERT ) 25 MG tablet Take 1 tablet (25 mg total) by mouth 3 (three) times daily as needed for dizziness.   [DISCONTINUED] metFORMIN  (GLUCOPHAGE -XR) 500 MG 24 hr tablet Take 1,000 mg by mouth in the morning and at bedtime.   [DISCONTINUED] naproxen  sodium (ALEVE ) 220 MG tablet Take 440 mg by mouth 2 (two) times daily as needed (pain).   No facility-administered medications prior to visit.   Allergies  Allergen Reactions   Compazine  [Prochlorperazine] Anaphylaxis   Thiethylperazine Anaphylaxis   Vancomycin  Rash and Itching    Patient developed localized itching, erythema, swelling.  Onset within 2 min of starting vanc IV.  Appears to be a true vanc allergy and not red-mans syndrome. Patient developed localized itching, erythema, swelling.  Onset within 2 min of starting vanc IV.  Appears to be a true vanc allergy and not red-mans syndrome.    Compazine    Lasix [Furosemide]     BREAKS OUT IN HIVES   Latex     Migraine    Immunization History  Administered Date(s) Administered   DTaP 12/13/2009   Fluad Trivalent(High Dose 65+) 11/14/2013   Influenza Inj Mdck Quad Pf 11/21/2021   Influenza, Mdck, Trivalent,PF 6+ MOS(egg free) 10/14/2022    Influenza-Unspecified 02/05/2019   PFIZER(Purple Top)SARS-COV-2 Vaccination 04/07/2019, 04/28/2019   PNEUMOCOCCAL CONJUGATE-20 02/23/2022   Pfizer Covid-19 Vaccine Bivalent Booster 51yrs & up 12/02/2020  Pneumococcal Polysaccharide-23 07/05/2017   Tdap 11/20/2009, 02/22/2019   Zoster Recombinant(Shingrix ) 08/04/2023    Health Maintenance  Topic Date Due   FOOT EXAM  Never done   Cervical Cancer Screening (HPV/Pap Cotest)  Never done   Colonoscopy  Never done   MAMMOGRAM  Never done   OPHTHALMOLOGY EXAM  04/04/2020   Lung Cancer Screening  08/14/2021   COVID-19 Vaccine (4 - 2024-25 season) 10/04/2022   INFLUENZA VACCINE  09/03/2023   Zoster Vaccines- Shingrix  (2 of 2) 09/29/2023   HEMOGLOBIN A1C  02/04/2024   Diabetic kidney evaluation - eGFR measurement  08/03/2024   Diabetic kidney evaluation - Urine ACR  08/03/2024   DTaP/Tdap/Td (4 - Td or Tdap) 02/21/2029   Pneumococcal Vaccine 44-58 Years old  Completed   HIV Screening  Completed   Hepatitis B Vaccines  Aged Out   HPV VACCINES  Aged Out   Meningococcal B Vaccine  Aged Out   Hepatitis C Screening  Discontinued    Patient Care Team: Shelie Lansing, Lauraine SAILOR, DO as PCP - General (Family Medicine) Santo Stanly LABOR, MD as PCP - Cardiology (Cardiology)       Objective    BP (!) 161/104 (BP Location: Left Arm, Patient Position: Sitting, Cuff Size: Large)   Pulse 62   Resp 14   Ht 5' 3 (1.6 m)   Wt 217 lb (98.4 kg)   SpO2 96%   BMI 38.44 kg/m     Physical Exam Vitals and nursing note reviewed.  Constitutional:      General: She is not in acute distress.    Appearance: Normal appearance.  HENT:     Head: Normocephalic and atraumatic.  Eyes:     General: No scleral icterus.    Conjunctiva/sclera: Conjunctivae normal.  Cardiovascular:     Rate and Rhythm: Normal rate.  Pulmonary:     Effort: Pulmonary effort is normal.  Neurological:     Mental Status: She is alert and oriented to person, place, and time.  Mental status is at baseline.  Psychiatric:        Mood and Affect: Mood normal.        Behavior: Behavior normal.     Depression Screen    08/04/2023    1:26 PM 04/05/2019    3:58 PM 02/22/2019   11:02 AM  PHQ 2/9 Scores  PHQ - 2 Score 2 1 1   PHQ- 9 Score 10 6 9    Results for orders placed or performed in visit on 08/04/23  Comprehensive metabolic panel with GFR  Result Value Ref Range   Glucose 320 (H) 70 - 99 mg/dL   BUN 17 8 - 27 mg/dL   Creatinine, Ser 9.14 0.57 - 1.00 mg/dL   eGFR 78 >40 fO/fpw/8.26   BUN/Creatinine Ratio 20 12 - 28   Sodium 136 134 - 144 mmol/L   Potassium 4.3 3.5 - 5.2 mmol/L   Chloride 96 96 - 106 mmol/L   CO2 23 20 - 29 mmol/L   Calcium  9.5 8.7 - 10.3 mg/dL   Total Protein 7.5 6.0 - 8.5 g/dL   Albumin 4.4 3.8 - 4.9 g/dL   Globulin, Total 3.1 1.5 - 4.5 g/dL   Bilirubin Total 0.5 0.0 - 1.2 mg/dL   Alkaline Phosphatase 124 (H) 44 - 121 IU/L   AST 39 0 - 40 IU/L   ALT 30 0 - 32 IU/L  Hemoglobin A1c  Result Value Ref Range   Hgb A1c MFr Bld 14.6 (H) 4.8 -  5.6 %   Est. average glucose Bld gHb Est-mCnc 372 mg/dL  Lipid panel  Result Value Ref Range   Cholesterol, Total 161 100 - 199 mg/dL   Triglycerides 793 (H) 0 - 149 mg/dL   HDL 50 >60 mg/dL   VLDL Cholesterol Cal 34 5 - 40 mg/dL   LDL Chol Calc (NIH) 77 0 - 99 mg/dL   Chol/HDL Ratio 3.2 0.0 - 4.4 ratio  VITAMIN D  25 Hydroxy (Vit-D Deficiency, Fractures)  Result Value Ref Range   Vit D, 25-Hydroxy 34.6 30.0 - 100.0 ng/mL  Microalbumin / creatinine urine ratio  Result Value Ref Range   Creatinine, Urine 64.7 Not Estab. mg/dL   Microalbumin, Urine 48.0 Not Estab. ug/mL   Microalb/Creat Ratio 80 (H) 0 - 29 mg/g creat  TSH Rfx on Abnormal to Free T4  Result Value Ref Range   TSH 7.000 (H) 0.450 - 4.500 uIU/mL  Vitamin B12  Result Value Ref Range   Vitamin B-12 622 232 - 1,245 pg/mL  T4F  Result Value Ref Range   T4,Free (Direct) 1.59 0.82 - 1.77 ng/dL    Assessment & Plan     Type 2  diabetes mellitus with diabetic neuropathy, without long-term current use of insulin  (HCC) -     Comprehensive metabolic panel with GFR -     Hemoglobin A1c -     Lipid panel -     Microalbumin / creatinine urine ratio -     Vitamin B12 -     Amb Referral to Nutrition and Diabetic Education -     Tirzepatide ; Inject 2.5 mg into the skin once a week.  Dispense: 2 mL; Refill: 0 -     Tirzepatide ; Inject 5 mg into the skin once a week.  Dispense: 6 mL; Refill: 0 -     Synjardy  XR; Take 1 tablet by mouth daily.  Dispense: 90 tablet; Refill: 3  Establishing care with new doctor, encounter for  Morbid obesity (HCC)  Chronic kidney disease (CKD) stage G1/A2, glomerular filtration rate (GFR) equal to or greater than 90 mL/min/1.73 square meter and albuminuria creatinine ratio between 30-299 mg/g  Hypertension associated with diabetes (HCC) -     Valsartan -hydroCHLOROthiazide ; Take 1 tablet by mouth daily. HOLD FOR NEXT FILL  Dispense: 30 tablet; Refill: 0 -     Carvedilol ; Take 1 tablet (3.125 mg total) by mouth 2 (two) times daily.  Dispense: 180 tablet; Refill: 3  Coronary artery disease involving native coronary artery of native heart without angina pectoris  History of right coronary artery stent placement -     Clopidogrel  Bisulfate; Take 1 tablet (75 mg total) by mouth daily.  Dispense: 90 tablet; Refill: 3 -     Carvedilol ; Take 1 tablet (3.125 mg total) by mouth 2 (two) times daily.  Dispense: 180 tablet; Refill: 3 -     Aspirin ; Take 1 tablet (81 mg total) by mouth daily.  Dispense: 90 tablet; Refill: 3  Hypothyroidism, unspecified type -     TSH Rfx on Abnormal to Free T4 -     Levothyroxine  Sodium; Take 1 tablet (175 mcg total) by mouth daily.  Dispense: 90 tablet; Refill: 3 -     T4F  Chronic obstructive pulmonary disease, unspecified COPD type (HCC) -     Symbicort ; Inhale 2 puffs into the lungs 2 (two) times daily.  Dispense: 1 each; Refill: 0 -     Albuterol  Sulfate HFA;  Inhale 2 puffs into the lungs every  4 (four) hours as needed for wheezing or shortness of breath.  Dispense: 8 g; Refill: 0  Loud snoring -     Ambulatory referral to Sleep Studies  Generalized anxiety disorder -     Sertraline  HCl; Take 1 tablet (50 mg total) by mouth daily.  Dispense: 90 tablet; Refill: 3  Hyperlipidemia associated with type 2 diabetes mellitus (HCC) -     Rosuvastatin  Calcium ; Take 1 tablet (40 mg total) by mouth daily.  Dispense: 90 tablet; Refill: 3  Cigarette nicotine  dependence in remission -     Ambulatory Referral for Lung Cancer Scre  Vitamin D  deficiency -     VITAMIN D  25 Hydroxy (Vit-D Deficiency, Fractures)  High risk medication use -     Vitamin B12  Need for shingles vaccine -     Varicella-zoster vaccine IM  Encounter for screening mammogram for breast cancer -     3D Screening Mammogram, Left and Right; Future  Autoimmune hepatitis (HCC) Assessment & Plan: Noted.  No acute concerns; currently with stable liver enzymes and no alcohol use.  Will check blood work today.  Continue to monitor.       Type 2 Diabetes Mellitus with neuropathy and CKD G1/A2 Long-standing diabetes with recent non-adherence to Mounjaro  and Synjardy , affecting wound healing. Neuropathy symptoms present. Interested in dietary management.  Last A1c 7.6 on 10/14/2022. - Prescribe Mounjaro  2.5 mg weekly, as well as, for delayed fill, Mounjaro  5 mg weekly. - Prescribe Synjardy . - Refer to nutritionist.  Chronic kidney disease stage G1/A2 eGFR on average approximately 100 in recent blood work (in [employer] from [place]); last two uACRs showed mildly elevated ratio. - Continue to optimize risk factors.  Continue to monitor. - Continue Jardiance   Hypertension Chronic hypertension managed with valsartan -hydrochlorothiazide  and carvedilol . Stress may affect control.  Blood pressure elevated today.  Patient to monitor blood pressure at home, then bring blood pressure log  and blood pressure cuff to her next visit for recheck. - Continue valsartan -hydrochlorothiazide . - Continue carvedilol .  Coronary Artery Disease with Stent Placement Post-stent management with Plavix  and aspirin .  No acute concerns. - Continue Plavix . - Continue aspirin .  Chronic Obstructive Pulmonary Disease (COPD) Irregular Symbicort  use. Emphasized regular use for symptom control. - Instruct regular Symbicort  use. - Prescribe albuterol  as needed.  Hypothyroidism Managed with levothyroxine  175 mcg. Current supply adequate until month-end.  Last TSH 1.71 on 10/14/2022. - Prescribe levothyroxine  175 mcg.  Anxiety and Depression Anxiety and depression, previously on sertraline . Declined Wellbutrin . - Restart sertraline .    Return in about 6 weeks (around 09/15/2023) for Chronic f/u, then at 3 months for CPE.     I discussed the assessment and treatment plan with the patient  The patient was provided an opportunity to ask questions and all were answered. The patient agreed with the plan and demonstrated an understanding of the instructions.   The patient was advised to call back or seek an in-person evaluation if the symptoms worsen or if the condition fails to improve as anticipated.    LAURAINE LOISE BUOY, DO  [facility] [phone] (phone) [phone] (fax)  [facility]

## 2023-08-04 NOTE — Patient Instructions (Signed)
 Please call the [facility] [phone]) to schedule a routine screening mammogram.     Check your blood pressure once daily, and any time you have concerning symptoms like headache, chest pain, dizziness, shortness of breath, or vision changes.   Our goal is less than 130/80.  To appropriately check your blood pressure, make sure you do the following:  1) Avoid caffeine, exercise, or tobacco products for 30 minutes before checking. Empty your bladder. 2) Sit with your back supported in a flat-backed chair. Rest your arm on something flat (arm of the chair, table, etc). 3) Sit still with your feet flat on the floor, resting, for at least 5 minutes.  4) Check your blood pressure. Take 1-2 readings.  5) Write down these readings and bring with you to any provider appointments.  Bring your home blood pressure machine with you to a provider's office for accuracy comparison at least once a year.   Make sure you take your blood pressure medications before you come to any office visit, even if you were asked to fast for labs.

## 2023-08-05 LAB — COMPREHENSIVE METABOLIC PANEL WITH GFR
ALT: 30 IU/L (ref 0–32)
AST: 39 IU/L (ref 0–40)
Albumin: 4.4 g/dL (ref 3.8–4.9)
Alkaline Phosphatase: 124 IU/L — ABNORMAL HIGH (ref 44–121)
BUN/Creatinine Ratio: 20 (ref 12–28)
BUN: 17 mg/dL (ref 8–27)
Bilirubin Total: 0.5 mg/dL (ref 0.0–1.2)
CO2: 23 mmol/L (ref 20–29)
Calcium: 9.5 mg/dL (ref 8.7–10.3)
Chloride: 96 mmol/L (ref 96–106)
Creatinine, Ser: 0.85 mg/dL (ref 0.57–1.00)
Globulin, Total: 3.1 g/dL (ref 1.5–4.5)
Glucose: 320 mg/dL — ABNORMAL HIGH (ref 70–99)
Potassium: 4.3 mmol/L (ref 3.5–5.2)
Sodium: 136 mmol/L (ref 134–144)
Total Protein: 7.5 g/dL (ref 6.0–8.5)
eGFR: 78 mL/min/{1.73_m2} (ref >59)

## 2023-08-05 LAB — LIPID PANEL
Chol/HDL Ratio: 3.2 ratio (ref 0.0–4.4)
Cholesterol, Total: 161 mg/dL (ref 100–199)
HDL: 50 mg/dL (ref >39)
LDL Chol Calc (NIH): 77 mg/dL (ref 0–99)
Triglycerides: 206 mg/dL — ABNORMAL HIGH (ref 0–149)
VLDL Cholesterol Cal: 34 mg/dL (ref 5–40)

## 2023-08-05 LAB — TSH RFX ON ABNORMAL TO FREE T4: TSH: 7 u[IU]/mL — ABNORMAL HIGH (ref 0.450–4.500)

## 2023-08-05 LAB — VITAMIN D 25 HYDROXY (VIT D DEFICIENCY, FRACTURES): Vit D, 25-Hydroxy: 34.6 ng/mL (ref 30.0–100.0)

## 2023-08-05 LAB — T4F: T4,Free (Direct): 1.59 ng/dL (ref 0.82–1.77)

## 2023-08-05 LAB — HEMOGLOBIN A1C
Est. average glucose Bld gHb Est-mCnc: 372 mg/dL
Hgb A1c MFr Bld: 14.6 % — ABNORMAL HIGH (ref 4.8–5.6)

## 2023-08-05 LAB — MICROALBUMIN / CREATININE URINE RATIO
Creatinine, Urine: 64.7 mg/dL
Microalb/Creat Ratio: 80 mg/g{creat} — ABNORMAL HIGH (ref 0–29)
Microalbumin, Urine: 51.9 ug/mL

## 2023-08-05 LAB — VITAMIN B12: Vitamin B-12: 622 pg/mL (ref 232–1245)

## 2023-08-10 ENCOUNTER — Ambulatory Visit: Payer: Self-pay | Admitting: Family Medicine

## 2023-08-10 ENCOUNTER — Encounter: Payer: Self-pay | Admitting: Family Medicine

## 2023-08-10 DIAGNOSIS — K754 Autoimmune hepatitis: Secondary | ICD-10-CM | POA: Insufficient documentation

## 2023-08-10 DIAGNOSIS — E559 Vitamin D deficiency, unspecified: Secondary | ICD-10-CM

## 2023-08-10 DIAGNOSIS — K5730 Diverticulosis of large intestine without perforation or abscess without bleeding: Secondary | ICD-10-CM | POA: Insufficient documentation

## 2023-08-10 NOTE — Assessment & Plan Note (Addendum)
 Noted.  No acute concerns; currently with stable liver enzymes and no alcohol use.  Will check blood work today.  Continue to monitor.

## 2023-08-12 ENCOUNTER — Encounter: Admitting: Physician Assistant

## 2023-08-12 DIAGNOSIS — S81812A Laceration without foreign body, left lower leg, initial encounter: Secondary | ICD-10-CM | POA: Diagnosis not present

## 2023-08-17 ENCOUNTER — Ambulatory Visit (INDEPENDENT_AMBULATORY_CARE_PROVIDER_SITE_OTHER): Admitting: Sleep Medicine

## 2023-08-17 ENCOUNTER — Encounter: Payer: Self-pay | Admitting: Sleep Medicine

## 2023-08-17 DIAGNOSIS — G4733 Obstructive sleep apnea (adult) (pediatric): Secondary | ICD-10-CM

## 2023-08-19 ENCOUNTER — Encounter: Admitting: Physician Assistant

## 2023-08-19 DIAGNOSIS — S81812A Laceration without foreign body, left lower leg, initial encounter: Secondary | ICD-10-CM | POA: Diagnosis not present

## 2023-08-20 ENCOUNTER — Ambulatory Visit (INDEPENDENT_AMBULATORY_CARE_PROVIDER_SITE_OTHER): Admitting: Sleep Medicine

## 2023-08-20 ENCOUNTER — Encounter: Payer: Self-pay | Admitting: Sleep Medicine

## 2023-08-20 VITALS — BP 110/70 | HR 72 | Temp 98.1°F | Ht 63.0 in | Wt 214.2 lb

## 2023-08-20 DIAGNOSIS — I10 Essential (primary) hypertension: Secondary | ICD-10-CM | POA: Diagnosis not present

## 2023-08-20 DIAGNOSIS — G4700 Insomnia, unspecified: Secondary | ICD-10-CM | POA: Diagnosis not present

## 2023-08-20 DIAGNOSIS — F411 Generalized anxiety disorder: Secondary | ICD-10-CM

## 2023-08-20 DIAGNOSIS — G4733 Obstructive sleep apnea (adult) (pediatric): Secondary | ICD-10-CM | POA: Diagnosis not present

## 2023-08-20 DIAGNOSIS — Z87891 Personal history of nicotine dependence: Secondary | ICD-10-CM

## 2023-08-20 DIAGNOSIS — F5104 Psychophysiologic insomnia: Secondary | ICD-10-CM

## 2023-08-20 LAB — HM DIABETES EYE EXAM

## 2023-08-20 MED ORDER — VITAMIN D (ERGOCALCIFEROL) 1.25 MG (50000 UNIT) PO CAPS: 50000.0000 [IU] | ORAL_CAPSULE | ORAL | 1 refills | Status: AC

## 2023-08-20 MED ORDER — TRAZODONE HCL 50 MG PO TABS: 50.0000 mg | ORAL_TABLET | Freq: Every day | ORAL | 0 refills | Status: DC

## 2023-08-20 NOTE — Patient Instructions (Signed)
 [name]

## 2023-08-20 NOTE — Progress Notes (Signed)
 Name:[name] MRN: [id number] DOB: [birth year]   CHIEF COMPLAINT:  EXCESSIVE DAYTIME SLEEPINESS   HISTORY OF PRESENT ILLNESS:  [name] is a 58 y.o. w/ a h/o HTN, CAD, DMII, hypothyroidism, hyperlipidemia and obesity who presents for c/o loud snoring and excessive daytime sleepiness which has been present for several years. Reports nocturnal awakenings due to nocturia, and occasionally has difficulty falling back to sleep. Denies any significant weight changes. Admits to morning headaches and dry mouth. Denies morning headaches, RLS symptoms, dream enactment, cataplexy, hypnagogic or hypnapompic hallucinations. Reports a family history of sleep apnea. Reports occasional drowsy driving. Drinks 1-2 cups of coffee and 1 soda daily, denies alcohol or illicit drug use. Former smoker.   Bedtime 10 pm-12 am Sleep onset variable Rise time 5:30 am-6 on work days and summer off is variable   EPWORTH SLEEP SCORE    08/20/2023   10:00 AM  Results of the Epworth flowsheet  Sitting and reading 1  Watching TV 1  Sitting, inactive in a public place (e.g. a theatre or a meeting) 0  As a passenger in a car for an hour without a break 3  Lying down to rest in the afternoon when circumstances permit 3  Sitting and talking to someone 0  Sitting quietly after a lunch without alcohol 2  In a car, while stopped for a few minutes in traffic 0  Total score 10    PAST MEDICAL HISTORY :   has a past medical history of Allergy, Anxiety and depression, Arthritis, COPD (chronic obstructive pulmonary disease) (HCC), Diabetes mellitus, GERD (gastroesophageal reflux disease), HTN (hypertension), Hyperlipidemia, Hypothyroidism, Migraines, and TIA (transient ischemic attack).  has a past surgical history that includes Ankle surgery x 2; Exploratory Laparoscopy; Partial hysterectomy; Cholecystectomy; Abdominal hysterectomy; Fracture surgery; and Carotid stent (06/04/2022). Prior to Admission  medications   Medication Sig Start Date End Date Taking? Authorizing Provider  albuterol  (VENTOLIN  HFA) 108 (90 Base) MCG/ACT inhaler Inhale 2 puffs into the lungs every 4 (four) hours as needed for wheezing or shortness of breath. 08/04/23  Yes Pardue, Lauraine SAILOR, DO  aspirin  EC 81 MG tablet Take 1 tablet (81 mg total) by mouth daily. 08/04/23 08/03/24 Yes Pardue, Lauraine SAILOR, DO  blood glucose meter kit and supplies Dispense based on patient and insurance preference. Use up to four times daily as directed. (FOR ICD-10 E10.9, E11.9). 02/22/19  Yes Danford, Rockie D, NP  carvedilol  (COREG ) 3.125 MG tablet Take 1 tablet (3.125 mg total) by mouth 2 (two) times daily. 08/04/23  Yes Pardue, Lauraine SAILOR, DO  clopidogrel  (PLAVIX ) 75 MG tablet Take 1 tablet (75 mg total) by mouth daily. 08/04/23  Yes Pardue, Lauraine SAILOR, DO  Empagliflozin -metFORMIN  HCl ER (SYNJARDY  XR) 12.06-998 MG TB24 Take 1 tablet by mouth daily. 08/04/23  Yes Pardue, Lauraine SAILOR, DO  levothyroxine  (SYNTHROID ) 175 MCG tablet Take 1 tablet (175 mcg total) by mouth daily. 08/04/23  Yes Pardue, Lauraine SAILOR, DO  nitroGLYCERIN (NITROSTAT) 0.4 MG SL tablet Place 0.4 mg under the tongue. 01/29/21  Yes [provider]  ONETOUCH ULTRA test strip USE UP TO 4 TIMES A DAY AS DIRECTED 03/13/19  Yes Danford, Rockie D, NP  rosuvastatin  (CRESTOR ) 40 MG tablet Take 1 tablet (40 mg total) by mouth daily. 08/04/23  Yes Pardue, Lauraine SAILOR, DO  sertraline  (ZOLOFT ) 50 MG tablet Take 1 tablet (50 mg total) by mouth daily. 08/04/23  Yes Pardue, Lauraine SAILOR, DO  SYMBICORT  160-4.5 MCG/ACT inhaler  Inhale 2 puffs into the lungs 2 (two) times daily. 08/04/23  Yes Pardue, Lauraine SAILOR, DO  tirzepatide  (MOUNJARO ) 2.5 MG/0.5ML Pen Inject 2.5 mg into the skin once a week. 08/04/23  Yes Pardue, Lauraine SAILOR, DO  tirzepatide  (MOUNJARO ) 5 MG/0.5ML Pen Inject 5 mg into the skin once a week. 08/25/23  Yes Pardue, Lauraine SAILOR, DO  valsartan -hydrochlorothiazide  (DIOVAN -HCT) 160-25 MG tablet Take 1 tablet by mouth daily. HOLD FOR NEXT FILL  08/04/23  Yes Pardue, Lauraine SAILOR, DO  Vitamin D , Ergocalciferol , (DRISDOL) 1.25 MG (50000 UNIT) CAPS capsule Take 50,000 Units by mouth once a week. 09/12/20  Yes [provider]   Allergies  Allergen Reactions   Compazine  [Prochlorperazine] Anaphylaxis   Thiethylperazine Anaphylaxis   Vancomycin  Rash and Itching    Patient developed localized itching, erythema, swelling.  Onset within 2 min of starting vanc IV.  Appears to be a true vanc allergy and not red-mans syndrome. Patient developed localized itching, erythema, swelling.  Onset within 2 min of starting vanc IV.  Appears to be a true vanc allergy and not red-mans syndrome.    Compazine    Lasix [Furosemide]     BREAKS OUT IN HIVES   Latex     Migraine    FAMILY HISTORY:  family history includes Diabetes in her father; Heart attack in her father, maternal grandfather, paternal grandfather, and paternal uncle; Heart failure in her father; Hyperlipidemia in her father, maternal grandfather, maternal grandmother, paternal grandfather, and paternal grandmother; Stroke in her maternal grandmother and paternal grandmother. SOCIAL HISTORY:  reports that she quit smoking about 3 years ago. Her smoking use included cigarettes. She started smoking about 43 years ago. She has a 40 pack-year smoking history. She has never used smokeless tobacco. She reports that she does not currently use alcohol. She reports that she does not use drugs.   Review of Systems:  Gen:  Denies  fever, sweats, chills weight loss  HEENT: Denies blurred vision, double vision, ear pain, eye pain, hearing loss, nose bleeds, sore throat Cardiac:  No dizziness, chest pain or heaviness, chest tightness,edema, No JVD Resp:   No cough, -sputum production, -shortness of breath,-wheezing, -hemoptysis,  Gi: Denies swallowing difficulty, stomach pain, nausea or vomiting, diarrhea, constipation, bowel incontinence Gu:  Denies bladder incontinence, burning urine Ext:    Denies Joint pain, stiffness or swelling Skin: Denies  skin rash, easy bruising or bleeding or hives Endoc:  Denies polyuria, polydipsia , polyphagia or weight change Psych:   Denies depression, insomnia or hallucinations  Other:  All other systems negative  VITAL SIGNS: BP 110/70 (BP Location: Left Arm, Patient Position: Sitting, Cuff Size: Large)   Pulse 72   Temp 98.1 F (36.7 C) (Oral)   Ht 5' 3 (1.6 m)   Wt 214 lb 3.2 oz (97.2 kg)   SpO2 95%   BMI 37.94 kg/m    Physical Examination:   General Appearance: No distress  EYES PERRLA, EOM intact.   NECK Supple, No JVD Pulmonary: normal breath sounds, No wheezing.  CardiovascularNormal S1,S2.  No m/r/g.   Abdomen: Benign, Soft, non-tender. Skin:   warm, no rashes, no ecchymosis  Extremities: normal, no cyanosis, clubbing. Neuro:without focal findings,  speech normal  PSYCHIATRIC: Mood, affect within normal limits.   ASSESSMENT AND PLAN  OSA I suspect that OSA is likely present due to clinical presentation. Discussed the consequences of untreated sleep apnea. Advised not to drive drowsy for safety of patient and others. Will complete further evaluation  with a home sleep study and follow up to review results.    HTN Stable, on current management. Following with PCP.   Anxiety Stable, on current management.   Insomnia Counseled patient on stimulus control improving sleep hygiene practices. Will also try patient on Trazodone 50 mg nightly.    Patient  satisfied with Plan of action and management. All questions answered  I spent a total of 47 minutes reviewing chart data, face-to-face evaluation with the patient, counseling and coordination of care as detailed above.    Davia Smyre, M.D.  Sleep Medicine [facility]

## 2023-08-20 NOTE — Progress Notes (Signed)
 Patient was not seen

## 2023-08-23 ENCOUNTER — Encounter: Payer: Self-pay | Admitting: Family Medicine

## 2023-08-26 ENCOUNTER — Ambulatory Visit: Admitting: Physician Assistant

## 2023-09-10 ENCOUNTER — Encounter: Attending: Physician Assistant | Admitting: Physician Assistant

## 2023-09-10 DIAGNOSIS — E039 Hypothyroidism, unspecified: Secondary | ICD-10-CM | POA: Diagnosis not present

## 2023-09-10 DIAGNOSIS — S8012XD Contusion of left lower leg, subsequent encounter: Secondary | ICD-10-CM | POA: Diagnosis not present

## 2023-09-10 DIAGNOSIS — I10 Essential (primary) hypertension: Secondary | ICD-10-CM | POA: Insufficient documentation

## 2023-09-10 DIAGNOSIS — E11622 Type 2 diabetes mellitus with other skin ulcer: Secondary | ICD-10-CM | POA: Diagnosis not present

## 2023-09-10 DIAGNOSIS — S81812D Laceration without foreign body, left lower leg, subsequent encounter: Secondary | ICD-10-CM | POA: Diagnosis present

## 2023-09-10 DIAGNOSIS — L97822 Non-pressure chronic ulcer of other part of left lower leg with fat layer exposed: Secondary | ICD-10-CM | POA: Insufficient documentation

## 2023-09-13 ENCOUNTER — Encounter: Payer: Self-pay | Admitting: Family Medicine

## 2023-09-13 ENCOUNTER — Other Ambulatory Visit: Payer: Self-pay | Admitting: Family Medicine

## 2023-09-13 DIAGNOSIS — E1140 Type 2 diabetes mellitus with diabetic neuropathy, unspecified: Secondary | ICD-10-CM

## 2023-10-15 ENCOUNTER — Other Ambulatory Visit: Payer: Self-pay | Admitting: Family Medicine

## 2023-10-15 DIAGNOSIS — E1159 Type 2 diabetes mellitus with other circulatory complications: Secondary | ICD-10-CM

## 2023-11-12 ENCOUNTER — Ambulatory Visit: Admitting: Family Medicine

## 2023-11-12 VITALS — BP 112/97 | HR 89 | Temp 98.9°F | Ht 63.0 in | Wt 210.4 lb

## 2023-11-12 DIAGNOSIS — E039 Hypothyroidism, unspecified: Secondary | ICD-10-CM

## 2023-11-12 DIAGNOSIS — E1140 Type 2 diabetes mellitus with diabetic neuropathy, unspecified: Secondary | ICD-10-CM

## 2023-11-12 DIAGNOSIS — R0683 Snoring: Secondary | ICD-10-CM

## 2023-11-12 DIAGNOSIS — J0101 Acute recurrent maxillary sinusitis: Secondary | ICD-10-CM | POA: Diagnosis not present

## 2023-11-12 DIAGNOSIS — U071 COVID-19: Secondary | ICD-10-CM

## 2023-11-12 DIAGNOSIS — E1159 Type 2 diabetes mellitus with other circulatory complications: Secondary | ICD-10-CM

## 2023-11-12 DIAGNOSIS — Z7985 Long-term (current) use of injectable non-insulin antidiabetic drugs: Secondary | ICD-10-CM

## 2023-11-12 LAB — POC COVID19 BINAXNOW: SARS Coronavirus 2 Ag: POSITIVE — AB

## 2023-11-12 MED ORDER — TIRZEPATIDE 5 MG/0.5ML ~~LOC~~ SOAJ: 5.0000 mg | SUBCUTANEOUS | 0 refills | Status: AC

## 2023-11-12 MED ORDER — AMOXICILLIN-POT CLAVULANATE 875-125 MG PO TABS: 1.0000 | ORAL_TABLET | Freq: Two times a day (BID) | ORAL | 0 refills | Status: DC

## 2023-11-12 MED ORDER — NIRMATRELVIR/RITONAVIR (PAXLOVID)TABLET: 3.0000 | ORAL_TABLET | Freq: Two times a day (BID) | ORAL | 0 refills | Status: AC

## 2023-11-12 MED ORDER — BENZONATATE 100 MG PO CAPS: 100.0000 mg | ORAL_CAPSULE | Freq: Two times a day (BID) | ORAL | 0 refills | Status: DC | PRN

## 2023-11-12 MED ORDER — TIRZEPATIDE 2.5 MG/0.5ML ~~LOC~~ SOAJ
2.5000 mg | SUBCUTANEOUS | 0 refills | Status: AC
Start: 2023-11-12 — End: ?

## 2023-11-12 NOTE — Patient Instructions (Addendum)
 Please call the [facility] [phone]) to schedule a routine screening mammogram.

## 2023-11-12 NOTE — Progress Notes (Signed)
 Established patient visit   Patient: [name]   DOB: 09/26/1965   58 y.o. Female  MRN: [id number] Visit Date: 11/12/2023  Today's healthcare provider: LAURAINE LOISE BUOY, DO   Chief Complaint  Patient presents with   Acute Visit    Patient is here to discuss medications and labs, however she came in with a positive at home test for COVID.  Stated that she started feeling really bad on Wednesday.   Subjective    HPI [name] is a 58 year old female who presents with COVID-19 symptoms and chronic sinus and ear issues.  She tested positive for COVID-19 on Thursday after experiencing fever and malaise since Wednesday. Her fever peaked at 101F, described as feeling like her 'brain was on fire'. She has sinus congestion and a sore throat, initially attributed to her chronic sinus issues.  She has a history of chronic sinus and ear problems for over two weeks, with regular sinus congestion and ear discomfort, including pain and itchiness. She mentions a history of nasal trauma from being hit in the nose with a ball in middle school, contributing to her nasal issues.  She experiences dizziness and lightheadedness, and believes it may be due to COVID-19. She also reports chest pressure and pain, particularly in the center of her chest, present for about two weeks.  She has a history of diabetes with a recent A1c of 14 and has been using Synjardiant. She reports issues with obtaining Mounjaro , which she was previously prescribed.  She uses Symbicort  and albuterol  inhalers regularly and takes trazodone  at night to aid sleep, which she finds effective without feeling 'drugged'.  She has a history of endometriosis, which led to the removal of her uterus and one ovary. She reports having had abnormal Pap smears in the past but has not had any since her surgery.  She received a flu shot a week ago and has been regularly getting COVID-19 vaccinations, although she has not yet  received the one for this year.       Medications: Outpatient Medications Prior to Visit  Medication Sig   albuterol  (VENTOLIN  HFA) 108 (90 Base) MCG/ACT inhaler Inhale 2 puffs into the lungs every 4 (four) hours as needed for wheezing or shortness of breath.   aspirin  EC 81 MG tablet Take 1 tablet (81 mg total) by mouth daily.   blood glucose meter kit and supplies Dispense based on patient and insurance preference. Use up to four times daily as directed. (FOR ICD-10 E10.9, E11.9).   carvedilol  (COREG ) 3.125 MG tablet Take 1 tablet (3.125 mg total) by mouth 2 (two) times daily.   clopidogrel  (PLAVIX ) 75 MG tablet Take 1 tablet (75 mg total) by mouth daily.   Empagliflozin -metFORMIN  HCl ER (SYNJARDY  XR) 12.06-998 MG TB24 Take 1 tablet by mouth daily.   levothyroxine  (SYNTHROID ) 175 MCG tablet Take 1 tablet (175 mcg total) by mouth daily.   nitroGLYCERIN (NITROSTAT) 0.4 MG SL tablet Place 0.4 mg under the tongue.   ONETOUCH ULTRA test strip USE UP TO 4 TIMES A DAY AS DIRECTED   rosuvastatin  (CRESTOR ) 40 MG tablet Take 1 tablet (40 mg total) by mouth daily.   sertraline  (ZOLOFT ) 50 MG tablet Take 1 tablet (50 mg total) by mouth daily.   SYMBICORT  160-4.5 MCG/ACT inhaler Inhale 2 puffs into the lungs 2 (two) times daily.   traZODone  (DESYREL ) 50 MG tablet Take 1 tablet (50 mg total) by mouth at bedtime.  valsartan -hydrochlorothiazide  (DIOVAN -HCT) 160-25 MG tablet TAKE 1 TABLET BY MOUTH DAILY   Vitamin D , Ergocalciferol , (DRISDOL ) 1.25 MG (50000 UNIT) CAPS capsule Take 1 capsule (50,000 Units total) by mouth every 7 (seven) days.   [DISCONTINUED] tirzepatide  (MOUNJARO ) 2.5 MG/0.5ML Pen Inject 2.5 mg into the skin once a week.   [DISCONTINUED] tirzepatide  (MOUNJARO ) 5 MG/0.5ML Pen Inject 5 mg into the skin once a week.   No facility-administered medications prior to visit.        Objective    BP (!) 112/97 (BP Location: Left Arm, Patient Position: Sitting, Cuff Size: Normal)   Pulse  89   Temp 98.9 F (37.2 C) (Oral)   Ht 5' 3 (1.6 m)   Wt 210 lb 6.4 oz (95.4 kg)   SpO2 97%   BMI 37.27 kg/m     Physical Exam Vitals reviewed.  Constitutional:      General: She is not in acute distress.    Appearance: Normal appearance. She is well-developed. She is not diaphoretic.  HENT:     Head: Normocephalic and atraumatic.     Right Ear: Tympanic membrane, ear canal and external ear normal.     Left Ear: Tympanic membrane, ear canal and external ear normal.     Nose: Congestion and rhinorrhea present.     Mouth/Throat:     Mouth: Mucous membranes are moist.     Pharynx: Oropharynx is clear. No oropharyngeal exudate or posterior oropharyngeal erythema.  Eyes:     General: No scleral icterus.    Conjunctiva/sclera: Conjunctivae normal.     Pupils: Pupils are equal, round, and reactive to light.  Cardiovascular:     Rate and Rhythm: Normal rate and regular rhythm.     Pulses: Normal pulses.     Heart sounds: Normal heart sounds. No murmur heard. Pulmonary:     Effort: Pulmonary effort is normal. No respiratory distress.     Breath sounds: Normal breath sounds. No wheezing or rales.  Musculoskeletal:     Cervical back: Neck supple.     Right lower leg: No edema.     Left lower leg: No edema.  Lymphadenopathy:     Cervical: No cervical adenopathy.  Skin:    General: Skin is warm and dry.     Findings: No rash.  Neurological:     Mental Status: She is alert.      Results for orders placed or performed in visit on 11/12/23  POC COVID-19 BinaxNow  Result Value Ref Range   SARS Coronavirus 2 Ag Positive (A) Negative    Assessment & Plan    Type 2 diabetes mellitus with diabetic neuropathy, without long-term current use of insulin  (HCC) -     Microalbumin / creatinine urine ratio -     Comprehensive metabolic panel with GFR -     Hemoglobin A1c -     Lipid panel -     Tirzepatide ; Inject 5 mg into the skin once a week.  Dispense: 6 mL; Refill: 0 -      Tirzepatide ; Inject 2.5 mg into the skin once a week.  Dispense: 2 mL; Refill: 0  COVID-19 virus RNA test result positive at limit of detection -     POC COVID-19 BinaxNow -     nirmatrelvir/ritonavir; Take 3 tablets by mouth 2 (two) times daily for 5 days. (Take nirmatrelvir 150 mg two tablets twice daily for 5 days and ritonavir 100 mg one tablet twice daily for 5 days) Patient GFR is  78  Dispense: 30 tablet; Refill: 0 -     Benzonatate ; Take 1 capsule (100 mg total) by mouth 2 (two) times daily as needed for cough.  Dispense: 20 capsule; Refill: 0  Acute recurrent maxillary sinusitis -     Amoxicillin -Pot Clavulanate; Take 1 tablet by mouth 2 (two) times daily.  Dispense: 20 tablet; Refill: 0 -     Ambulatory referral to ENT  Loud snoring  Hypothyroidism, unspecified type -     TSH Rfx on Abnormal to Free T4      Type 2 diabetes mellitus with diabetic neuropathy, without long-term current use of insulin  Type 2 diabetes with neuropathy and circulatory complications. Last A1c was 14, indicating poor glycemic control. Mounjaro  not received since July due to pharmacy issues. - Send prescription for Mounjaro  2.5 mg dose and a delay fill for 5 mg dose. - Plan to maintain on 5 mg dose for three months and reassess A1c.  COVID-19 virus RNA test result positive at limit of detection Acute COVID-19 infection confirmed by positive test. Symptoms include fever, sore throat, sinus congestion, and cough. Eligible for antiviral treatment due to symptom onset within five days and increased risk from comorbidities. - Prescribe Paxlovid for COVID-19 treatment. - Advise to remain fever-free without medication for 24 hours before returning to work and to wear a mask for five days after symptoms resolve. - Prescribe benzonatate  for cough management. - Ensure she has Symbicort  and albuterol  inhalers for respiratory symptoms.  Acute recurrent maxillary sinusitis Due to patient's frequent recurrent  sinusitis, will go ahead and prescribe Augmentin  as noted.  Refer patient to ENT for further evaluation and recommendations.  Loud snoring Suspected sleep apnea with loud snoring and previous referral to [facility] for a home sleep study. Equipment not received. - Provide contact information for [facility] to follow up on the sleep study referral. - Discuss sleep hygiene practices to improve sleep quality.  General Health Maintenance Due for a mammogram and lung cancer screening. Received flu shot a week ago and is due for a COVID-19 vaccine update. Post-hysterectomy, Pap smears are not required. - Provide contact information for mammogram and lung cancer screening appointments. - Advise to update COVID-19 vaccine when eligible.    Return in about 3 months (around 02/12/2024) for DM, Chronic f/u.      I discussed the assessment and treatment plan with the patient  The patient was provided an opportunity to ask questions and all were answered. The patient agreed with the plan and demonstrated an understanding of the instructions.   The patient was advised to call back or seek an in-person evaluation if the symptoms worsen or if the condition fails to improve as anticipated.    LAURAINE LOISE BUOY, DO  [facility] [phone] (phone) [phone] (fax)  [facility]

## 2023-11-14 ENCOUNTER — Encounter: Payer: Self-pay | Admitting: Family Medicine

## 2023-11-18 ENCOUNTER — Other Ambulatory Visit: Payer: Self-pay

## 2023-11-18 DIAGNOSIS — F5104 Psychophysiologic insomnia: Secondary | ICD-10-CM

## 2023-11-18 DIAGNOSIS — F411 Generalized anxiety disorder: Secondary | ICD-10-CM

## 2023-11-18 DIAGNOSIS — G4733 Obstructive sleep apnea (adult) (pediatric): Secondary | ICD-10-CM

## 2023-11-18 MED ORDER — TRAZODONE HCL 50 MG PO TABS: 50.0000 mg | ORAL_TABLET | Freq: Every day | ORAL | 3 refills | Status: AC

## 2023-12-09 LAB — LIPID PANEL
Chol/HDL Ratio: 3.1 ratio (ref 0.0–4.4)
Cholesterol, Total: 143 mg/dL (ref 100–199)
HDL: 46 mg/dL (ref >39)
LDL Chol Calc (NIH): 64 mg/dL (ref 0–99)
Triglycerides: 204 mg/dL — ABNORMAL HIGH (ref 0–149)
VLDL Cholesterol Cal: 33 mg/dL (ref 5–40)

## 2023-12-09 LAB — HEMOGLOBIN A1C
Est. average glucose Bld gHb Est-mCnc: 237 mg/dL
Hgb A1c MFr Bld: 9.9 % — ABNORMAL HIGH (ref 4.8–5.6)

## 2023-12-09 LAB — COMPREHENSIVE METABOLIC PANEL WITH GFR
ALT: 21 IU/L (ref 0–32)
AST: 28 IU/L (ref 0–40)
Albumin: 4.6 g/dL (ref 3.8–4.9)
Alkaline Phosphatase: 85 IU/L (ref 49–135)
BUN/Creatinine Ratio: 26 (ref 12–28)
BUN: 22 mg/dL (ref 8–27)
Bilirubin Total: 0.4 mg/dL (ref 0.0–1.2)
CO2: 20 mmol/L (ref 20–29)
Calcium: 9.9 mg/dL (ref 8.7–10.3)
Chloride: 98 mmol/L (ref 96–106)
Creatinine, Ser: 0.86 mg/dL (ref 0.57–1.00)
Globulin, Total: 2.9 g/dL (ref 1.5–4.5)
Glucose: 148 mg/dL — ABNORMAL HIGH (ref 70–99)
Potassium: 4.5 mmol/L (ref 3.5–5.2)
Sodium: 137 mmol/L (ref 134–144)
Total Protein: 7.5 g/dL (ref 6.0–8.5)
eGFR: 77 mL/min/1.73 (ref >59)

## 2023-12-09 LAB — MICROALBUMIN / CREATININE URINE RATIO
Creatinine, Urine: 74 mg/dL
Microalb/Creat Ratio: 85 mg/g{creat} — ABNORMAL HIGH (ref 0–29)
Microalbumin, Urine: 63.1 ug/mL

## 2023-12-09 LAB — T4F: T4,Free (Direct): 1.2 ng/dL (ref 0.82–1.77)

## 2023-12-09 LAB — TSH RFX ON ABNORMAL TO FREE T4: TSH: 7.8 u[IU]/mL — ABNORMAL HIGH (ref 0.450–4.500)

## 2023-12-15 ENCOUNTER — Ambulatory Visit: Payer: Self-pay | Admitting: Family Medicine

## 2023-12-15 DIAGNOSIS — E039 Hypothyroidism, unspecified: Secondary | ICD-10-CM

## 2023-12-15 MED ORDER — LEVOTHYROXINE SODIUM 200 MCG PO TABS
200.0000 ug | ORAL_TABLET | ORAL | 1 refills | Status: AC
Start: 2023-12-15 — End: ?

## 2024-01-04 ENCOUNTER — Other Ambulatory Visit: Payer: Self-pay | Admitting: Family Medicine

## 2024-01-04 DIAGNOSIS — E1159 Type 2 diabetes mellitus with other circulatory complications: Secondary | ICD-10-CM

## 2024-01-14 ENCOUNTER — Other Ambulatory Visit: Payer: Self-pay | Admitting: Family Medicine

## 2024-01-14 DIAGNOSIS — U071 COVID-19: Secondary | ICD-10-CM

## 2024-01-14 NOTE — Telephone Encounter (Signed)
 Please review in provider's absence.  LOV- 11/12/2023 NOV- 02/14/2024 LRF- 11/12/2023 Outpatient Medication Detail   Disp Refills Start End   benzonatate  (TESSALON ) 100 MG capsule 20 capsule 0 11/12/2023 --   Sig - Route: Take 1 capsule (100 mg total) by mouth 2 (two) times daily as needed for cough. - Oral   Sent to pharmacy as: benzonatate  (TESSALON ) 100 MG capsule   E-Prescribing Status: Receipt confirmed by pharmacy (11/12/2023  2:09 PM EDT)

## 2024-02-05 ENCOUNTER — Encounter: Payer: Self-pay | Admitting: Family Medicine

## 2024-02-05 DIAGNOSIS — K5909 Other constipation: Secondary | ICD-10-CM

## 2024-02-14 ENCOUNTER — Ambulatory Visit: Admitting: Family Medicine

## 2024-02-14 MED ORDER — LINACLOTIDE 145 MCG PO CAPS: 145.0000 ug | ORAL_CAPSULE | Freq: Every day | ORAL | 3 refills | Status: AC

## 2024-02-28 ENCOUNTER — Ambulatory Visit: Admitting: Family Medicine

## 2024-02-28 DIAGNOSIS — Z23 Encounter for immunization: Secondary | ICD-10-CM

## 2024-02-28 DIAGNOSIS — E039 Hypothyroidism, unspecified: Secondary | ICD-10-CM

## 2024-02-28 DIAGNOSIS — J449 Chronic obstructive pulmonary disease, unspecified: Secondary | ICD-10-CM

## 2024-02-28 DIAGNOSIS — E1140 Type 2 diabetes mellitus with diabetic neuropathy, unspecified: Secondary | ICD-10-CM

## 2024-02-28 DIAGNOSIS — Z8673 Personal history of transient ischemic attack (TIA), and cerebral infarction without residual deficits: Secondary | ICD-10-CM

## 2024-02-28 DIAGNOSIS — Z720 Tobacco use: Secondary | ICD-10-CM

## 2024-02-28 DIAGNOSIS — K754 Autoimmune hepatitis: Secondary | ICD-10-CM

## 2024-02-28 DIAGNOSIS — I152 Hypertension secondary to endocrine disorders: Secondary | ICD-10-CM

## 2024-02-28 DIAGNOSIS — N181 Chronic kidney disease, stage 1: Secondary | ICD-10-CM

## 2024-02-28 DIAGNOSIS — E1169 Type 2 diabetes mellitus with other specified complication: Secondary | ICD-10-CM

## 2024-02-28 DIAGNOSIS — Z1231 Encounter for screening mammogram for malignant neoplasm of breast: Secondary | ICD-10-CM

## 2024-02-28 DIAGNOSIS — E785 Hyperlipidemia, unspecified: Secondary | ICD-10-CM

## 2024-03-01 ENCOUNTER — Ambulatory Visit: Admitting: Family Medicine

## 2024-03-27 ENCOUNTER — Ambulatory Visit: Admitting: Family Medicine
